# Patient Record
Sex: Male | Born: 1940 | Race: White | Hispanic: No | Marital: Married | Smoking: Never smoker
Health system: Southern US, Community
[De-identification: ages and names within clinical notes are randomized; demographics above are authoritative.]

## PROBLEM LIST (undated history)

## (undated) DIAGNOSIS — E785 Hyperlipidemia, unspecified: Secondary | ICD-10-CM

## (undated) DIAGNOSIS — D649 Anemia, unspecified: Secondary | ICD-10-CM

## (undated) DIAGNOSIS — I499 Cardiac arrhythmia, unspecified: Secondary | ICD-10-CM

## (undated) DIAGNOSIS — R42 Dizziness and giddiness: Secondary | ICD-10-CM

## (undated) DIAGNOSIS — M1990 Unspecified osteoarthritis, unspecified site: Secondary | ICD-10-CM

## (undated) DIAGNOSIS — I2510 Atherosclerotic heart disease of native coronary artery without angina pectoris: Secondary | ICD-10-CM

## (undated) DIAGNOSIS — I10 Essential (primary) hypertension: Secondary | ICD-10-CM

## (undated) DIAGNOSIS — J189 Pneumonia, unspecified organism: Secondary | ICD-10-CM

## (undated) HISTORY — PX: TONSILLECTOMY: SUR1361

## (undated) HISTORY — DX: Essential (primary) hypertension: I10

## (undated) HISTORY — PX: EYE SURGERY: SHX253

## (undated) HISTORY — DX: Anemia, unspecified: D64.9

## (undated) HISTORY — DX: Hyperlipidemia, unspecified: E78.5

---

## 1968-04-24 HISTORY — PX: CARPAL TUNNEL RELEASE: SHX101

## 1997-04-24 HISTORY — PX: CARDIAC CATHETERIZATION: SHX172

## 2008-07-17 ENCOUNTER — Encounter: Payer: Self-pay | Admitting: Gastroenterology

## 2009-09-17 ENCOUNTER — Encounter: Payer: Self-pay | Admitting: Gastroenterology

## 2009-09-22 ENCOUNTER — Telehealth (INDEPENDENT_AMBULATORY_CARE_PROVIDER_SITE_OTHER): Payer: Self-pay

## 2010-04-26 ENCOUNTER — Encounter: Payer: Self-pay | Admitting: Orthopedic Surgery

## 2010-05-24 ENCOUNTER — Ambulatory Visit
Admission: RE | Admit: 2010-05-24 | Discharge: 2010-05-24 | Payer: Self-pay | Source: Home / Self Care | Attending: Orthopedic Surgery | Admitting: Orthopedic Surgery

## 2010-05-24 ENCOUNTER — Encounter: Payer: Self-pay | Admitting: Orthopedic Surgery

## 2010-05-24 DIAGNOSIS — M6740 Ganglion, unspecified site: Secondary | ICD-10-CM | POA: Insufficient documentation

## 2010-05-24 NOTE — Letter (Signed)
Summary: Internal Other Domingo Dimes  Internal Other Domingo Dimes   Imported By: Cloria Spring LPN 16/01/9603 54:09:81  _____________________________________________________________________  External Attachment:    Type:   Image     Comment:   External Document

## 2010-05-24 NOTE — Progress Notes (Signed)
Summary: up date med list  Phone Note Outgoing Call   Call placed by: Laporshia Hogen Call placed to: Patient Summary of Call: Reviewed med list, everything is the same, and no new medical hx. Initial call taken by: Cloria Spring LPN,  September 23, 5282 4:13 PM

## 2010-06-01 NOTE — Letter (Signed)
Summary: History form  History form   Imported By: Jacklynn Ganong 05/27/2010 08:49:34  _____________________________________________________________________  External Attachment:    Type:   Image     Comment:   External Document

## 2010-06-01 NOTE — Assessment & Plan Note (Signed)
Summary: CONSULT/TREAT FINGER CYST/NEED XRAY/REF [facility]   Visit Type:  new patient Referring Provider:  Dr. Dwana Melena  CC:  left hand.  History of Present Illness: I saw Jeremiah Murphy in the office today for an initial visit.  He is a 70 years old man with the complaint of:  left hand  Xrays today.  Medications: Lovastatin 20 mg, Atenolol 25 mg, Ecotrin 325 mg.  The patient reports a cyst or mass forming on the long finger, volar aspect of the middle phalanx. He said it was much larger 2 months ago and has since gone down. He has no pain over the area.  There is also a nodule on his thumb on the volar side, which she says actually hurts. This one does not. He says he has some difficulty when he tries to pick things up.  He has a history of carpal tunnel syndrome in the past. Doesn't seem to have symptoms currently.  Allergies (verified): No Known Drug Allergies  Past History:  Past Surgical History: Carpal Tunnel Release  Review of Systems  The review of systems is negative for Constitutional, Cardiovascular, Respiratory, Gastrointestinal, Genitourinary, Neurologic, Musculoskeletal, Endocrine, Psychiatric, Skin, HEENT, Immunology, and Hemoatologic.  Physical Exam  Axillary Nodes:  no significant adenopathy Psych:  alert and cooperative; normal mood and affect; normal attention span and concentration   Wrist/Hand Exam  General:    Well-developed, well-nourished, in no acute distress; alert and oriented x 3.    Skin:    on the volar aspect of the LEFT hand, long finger, middle phalanx. There is a mass with a bluish hue or color, which is approximately 5 x 7 mm, mobile, appears to be under the skin. It is nontender. The perfusion to the digit is normal. Color is normal.  Sensory:    Gross sensation intact in the upper extremities.    Motor:    Normal strength in the upper extremities.    Hand Exam:    Left:    Inspection:  Abnormal    he has full  range of motion in his LEFT hand   Impression & Recommendations:  Problem # 1:  GANGLION CYST (ICD-727.43) Assessment New  Orders: New Patient Level III (72536) Hand x-ray, minimum 3 views (73130) -a separate x-ray report.  Multiple views of the hand are shown. There is no evidence of bony involvement.  Impression normal hand. No evidence of mass coming from the bone.  Since this mass has started to get smaller. Recommend observation for 2 months recheck. If improved further. No further treatment necessary if not MRI of the hand.  Differential diagnosis includes ganglion cyst of tendon sheath, ganglion cyst, nodule of the tendon.  Patient Instructions: 1)  Please schedule a follow-up appointment in 2 months.check hand    Orders Added: 1)  New Patient Level III [99203] 2)  Hand x-ray, minimum 3 views [73130]

## 2010-06-01 NOTE — Letter (Signed)
Summary: *Orthopedic Consult Note  [facility]  [address]  [place], [place] [address]   Phone: [phone]  Fax: [phone]    Re:    Jeremiah Murphy DOB:    02-23-1941   Dear: Timothy Lasso    Thank you for requesting that we see the above patient for consultation.  A copy of the detailed office note will be sent under separate cover, for your review.  Evaluation today is consistent with:  1)  GANGLION CYST (ICD-727.43)   Our recommendation is for: observation and recheck in 2 months        Thank you for this opportunity to look after your patient.  Sincerely,   Terrance Mass. MD.

## 2010-07-26 ENCOUNTER — Ambulatory Visit: Payer: Self-pay | Admitting: Orthopedic Surgery

## 2011-07-13 ENCOUNTER — Ambulatory Visit (INDEPENDENT_AMBULATORY_CARE_PROVIDER_SITE_OTHER): Payer: Self-pay | Admitting: Otolaryngology

## 2011-08-31 ENCOUNTER — Ambulatory Visit (INDEPENDENT_AMBULATORY_CARE_PROVIDER_SITE_OTHER): Payer: Medicare Other | Admitting: Otolaryngology

## 2011-08-31 DIAGNOSIS — H905 Unspecified sensorineural hearing loss: Secondary | ICD-10-CM

## 2011-09-04 ENCOUNTER — Other Ambulatory Visit (INDEPENDENT_AMBULATORY_CARE_PROVIDER_SITE_OTHER): Payer: Self-pay | Admitting: Otolaryngology

## 2011-09-06 ENCOUNTER — Ambulatory Visit (HOSPITAL_COMMUNITY)
Admission: RE | Admit: 2011-09-06 | Discharge: 2011-09-06 | Disposition: A | Discharge status: Home / Self Care | Payer: Medicare Other | Source: Ambulatory Visit | Attending: Otolaryngology | Admitting: Otolaryngology

## 2011-09-06 DIAGNOSIS — G939 Disorder of brain, unspecified: Secondary | ICD-10-CM | POA: Insufficient documentation

## 2011-09-06 DIAGNOSIS — H9190 Unspecified hearing loss, unspecified ear: Secondary | ICD-10-CM | POA: Insufficient documentation

## 2011-09-06 LAB — CREATININE, SERUM: Creatinine, Ser: 1.05 mg/dL (ref 0.50–1.35)

## 2011-09-06 MED ORDER — GADOBENATE DIMEGLUMINE 529 MG/ML IV SOLN
15.0000 mL | Freq: Once | INTRAVENOUS | Status: AC | PRN
  Administered 2011-09-06: 15 mL via INTRAVENOUS

## 2012-10-17 ENCOUNTER — Ambulatory Visit (INDEPENDENT_AMBULATORY_CARE_PROVIDER_SITE_OTHER): Payer: Medicare Other | Admitting: Otolaryngology

## 2012-12-05 ENCOUNTER — Ambulatory Visit (INDEPENDENT_AMBULATORY_CARE_PROVIDER_SITE_OTHER): Payer: Medicare Other | Admitting: Otolaryngology

## 2012-12-05 DIAGNOSIS — H903 Sensorineural hearing loss, bilateral: Secondary | ICD-10-CM

## 2012-12-05 DIAGNOSIS — H905 Unspecified sensorineural hearing loss: Secondary | ICD-10-CM

## 2012-12-05 DIAGNOSIS — H6120 Impacted cerumen, unspecified ear: Secondary | ICD-10-CM

## 2013-04-28 ENCOUNTER — Encounter: Payer: Self-pay | Admitting: Gastroenterology

## 2013-05-22 ENCOUNTER — Ambulatory Visit: Payer: Medicare Other | Admitting: Gastroenterology

## 2013-06-10 ENCOUNTER — Ambulatory Visit: Payer: Medicare Other | Admitting: Gastroenterology

## 2013-06-24 ENCOUNTER — Encounter: Payer: Self-pay | Admitting: Gastroenterology

## 2013-06-24 ENCOUNTER — Encounter (INDEPENDENT_AMBULATORY_CARE_PROVIDER_SITE_OTHER): Payer: Self-pay

## 2013-06-24 ENCOUNTER — Other Ambulatory Visit: Payer: Self-pay | Admitting: Internal Medicine

## 2013-06-24 ENCOUNTER — Ambulatory Visit (INDEPENDENT_AMBULATORY_CARE_PROVIDER_SITE_OTHER): Payer: Commercial Managed Care - HMO | Admitting: Gastroenterology

## 2013-06-24 VITALS — BP 153/78 | HR 82 | Temp 97.3°F | Ht 68.0 in | Wt 169.0 lb

## 2013-06-24 DIAGNOSIS — D649 Anemia, unspecified: Secondary | ICD-10-CM | POA: Insufficient documentation

## 2013-06-24 MED ORDER — PEG 3350-KCL-NA BICARB-NACL 420 G PO SOLR: 4000.0000 mL | ORAL | Status: DC

## 2013-06-24 NOTE — Patient Instructions (Signed)
1. Collect stool specimen and return to our office. 2. Colonoscopy as scheduled. See separate instructions.

## 2013-06-24 NOTE — Assessment & Plan Note (Signed)
73 year old gentleman who presents for further workup of normocytic anemia. Hemoccult status unknown. Denies overt GI bleeding. He has never had a colonoscopy. Denies upper GI symptoms. Plan for colonoscopy in the near future.  I have discussed the risks, alternatives, benefits with regards to but not limited to the risk of reaction to medication, bleeding, infection, perforation and the patient is agreeable to proceed. Written consent to be obtained.  Complete ifobt.

## 2013-06-24 NOTE — Progress Notes (Signed)
Primary Care Physician:  Catalina Pizza, MD  Primary Gastroenterologist:  Roetta Sessions, MD   Chief Complaint  Patient presents with  . Anemia  . Colonoscopy    HPI:  Jeremiah Murphy is a 73 y.o. male here for further evaluation of normocytic anemia. Patient recently had a physical and lab work showed a hemoglobin of 12.6. Hematocrit 36.5, MCV 91. Hemoccult status unknown. States he used to have problems with constipation but this resolved with increasing dietary fiber. His bowel movements are now regular. Denies any blood or melena. Denies abdominal pain, vomiting, heartburn, weight loss, dysphagia, odynophagia. Appetite is good. No prior colonoscopy.    Current Outpatient Prescriptions  Medication Sig Dispense Refill  . aspirin 325 MG tablet Take 325 mg by mouth daily.      Marland Kitchen atenolol (TENORMIN) 25 MG tablet Take 25 mg by mouth daily.       . simvastatin (ZOCOR) 20 MG tablet Take 20 mg by mouth daily at 6 PM.        No current facility-administered medications for this visit.    Allergies as of 06/24/2013  . (No Known Allergies)    Past Medical History  Diagnosis Date  . HTN (hypertension)   . Dyslipidemia   . Normocytic anemia     Past Surgical History  Procedure Laterality Date  . Carpal tunnel release  1970    left    Family History  Problem Relation Age of Onset  . Breast cancer Mother   . Breast cancer Sister   . Breast cancer Sister   . Colon cancer Neg Hx     History   Social History  . Marital Status: Married    Spouse Name: N/A    Number of Children: N/A  . Years of Education: N/A   Occupational History  . Not on file.   Social History Main Topics  . Smoking status: Former Games developer  . Smokeless tobacco: Not on file  . Alcohol Use: No  . Drug Use: No  . Sexual Activity: Not on file   Other Topics Concern  . Not on file   Social History Narrative  . No narrative on file      ROS:  General: Negative for anorexia, weight loss, fever,  chills, fatigue, weakness. Eyes: Negative for vision changes.  ENT: Negative for hoarseness, difficulty swallowing , nasal congestion. CV: Negative for chest pain, angina, palpitations, dyspnea on exertion, peripheral edema.  Respiratory: Negative for dyspnea at rest, dyspnea on exertion, cough, sputum, wheezing.  GI: See history of present illness. GU:  Negative for dysuria, hematuria, urinary incontinence, urinary frequency, nocturnal urination.  MS: Negative for joint pain, low back pain.  Derm: Negative for rash or itching.  Neuro: Negative for weakness, abnormal sensation, seizure, frequent headaches, memory loss, confusion.  Psych: Negative for anxiety, depression, suicidal ideation, hallucinations.  Endo: Negative for unusual weight change.  Heme: Negative for bruising or bleeding. Allergy: Negative for rash or hives.    Physical Examination:  BP 153/78  Pulse 82  Temp(Src) 97.3 F (36.3 C) (Oral)  Ht 5\' 8"  (1.727 m)  Wt 169 lb (76.658 kg)  BMI 25.70 kg/m2   General: Well-nourished, well-developed in no acute distress.  Head: Normocephalic, atraumatic.   Eyes: Conjunctiva pink, no icterus. Mouth: Oropharyngeal mucosa moist and pink , no lesions erythema or exudate. Neck: Supple without thyromegaly, masses, or lymphadenopathy.  Lungs: Clear to auscultation bilaterally.  Heart: Regular rate and rhythm, no murmurs rubs or gallops.  Abdomen: Bowel sounds are normal, nontender, nondistended, no hepatosplenomegaly or masses, no abdominal bruits or    hernia , no rebound or guarding.   Rectal: defer Extremities: No lower extremity edema. No clubbing or deformities.  Neuro: Alert and oriented x 4 , grossly normal neurologically.  Skin: Warm and dry, no rash or jaundice.   Psych: Alert and cooperative, normal mood and affect.  Labs: Labs from 04/22/2013 White blood cell count 5900, hemoglobin 12.6, hematocrit 36.5, MCV 91, platelets 220,000. BUN 14, creatinine 0.97, total  bilirubin 0.5, alkaline phosphatase 49, AST 25, ALT 14, albumin 4.1.  Imaging Studies: No results found.

## 2013-06-24 NOTE — Progress Notes (Signed)
cc'd to pcp 

## 2013-06-30 ENCOUNTER — Encounter (HOSPITAL_COMMUNITY): Payer: Self-pay | Admitting: Pharmacy Technician

## 2013-07-02 ENCOUNTER — Ambulatory Visit (INDEPENDENT_AMBULATORY_CARE_PROVIDER_SITE_OTHER): Payer: Commercial Managed Care - HMO | Admitting: Gastroenterology

## 2013-07-02 DIAGNOSIS — D649 Anemia, unspecified: Secondary | ICD-10-CM

## 2013-07-02 LAB — IFOBT (OCCULT BLOOD): IFOBT: NEGATIVE

## 2013-07-02 NOTE — Progress Notes (Signed)
Pt return IFOBT test and it was NEGATIVE. 

## 2013-07-11 ENCOUNTER — Telehealth: Payer: Self-pay | Admitting: Gastroenterology

## 2013-07-11 NOTE — Telephone Encounter (Signed)
Patient has called and cancelled his TCS w/RMR on 03/25 he states that he cant do it, he cant be on a clear liquid diet for that long without getting sick, and he is refusing to do it at this time

## 2013-07-15 NOTE — Telephone Encounter (Signed)
Noted  

## 2013-07-16 ENCOUNTER — Encounter (HOSPITAL_COMMUNITY): Admission: RE | Payer: Self-pay | Source: Ambulatory Visit

## 2013-07-16 ENCOUNTER — Ambulatory Visit (HOSPITAL_COMMUNITY): Admission: RE | Admit: 2013-07-16 | Payer: Medicare Other | Source: Ambulatory Visit | Admitting: Internal Medicine

## 2013-07-16 SURGERY — COLONOSCOPY
Anesthesia: Moderate Sedation

## 2013-09-19 ENCOUNTER — Ambulatory Visit (HOSPITAL_COMMUNITY)
Admission: RE | Admit: 2013-09-19 | Discharge: 2013-09-19 | Disposition: A | Discharge status: Home / Self Care | Payer: Medicare PPO | Source: Ambulatory Visit | Attending: Internal Medicine | Admitting: Internal Medicine

## 2013-09-19 ENCOUNTER — Other Ambulatory Visit (HOSPITAL_COMMUNITY): Payer: Self-pay | Admitting: Internal Medicine

## 2013-09-19 DIAGNOSIS — R52 Pain, unspecified: Secondary | ICD-10-CM

## 2013-09-19 DIAGNOSIS — R609 Edema, unspecified: Secondary | ICD-10-CM

## 2013-09-19 DIAGNOSIS — M25569 Pain in unspecified knee: Secondary | ICD-10-CM | POA: Insufficient documentation

## 2013-12-19 ENCOUNTER — Other Ambulatory Visit (HOSPITAL_COMMUNITY): Payer: Self-pay | Admitting: Internal Medicine

## 2013-12-19 DIAGNOSIS — M25561 Pain in right knee: Secondary | ICD-10-CM

## 2013-12-24 ENCOUNTER — Ambulatory Visit (HOSPITAL_COMMUNITY)
Admission: RE | Admit: 2013-12-24 | Discharge: 2013-12-24 | Disposition: A | Discharge status: Home / Self Care | Payer: Medicare PPO | Source: Ambulatory Visit | Attending: Internal Medicine | Admitting: Internal Medicine

## 2013-12-24 DIAGNOSIS — M25569 Pain in unspecified knee: Secondary | ICD-10-CM | POA: Diagnosis present

## 2013-12-24 DIAGNOSIS — M6740 Ganglion, unspecified site: Secondary | ICD-10-CM | POA: Insufficient documentation

## 2013-12-24 DIAGNOSIS — M25561 Pain in right knee: Secondary | ICD-10-CM

## 2014-01-19 ENCOUNTER — Ambulatory Visit (INDEPENDENT_AMBULATORY_CARE_PROVIDER_SITE_OTHER): Payer: Commercial Managed Care - HMO | Admitting: Orthopedic Surgery

## 2014-01-19 ENCOUNTER — Ambulatory Visit: Payer: Commercial Managed Care - HMO

## 2014-01-19 VITALS — BP 160/84 | Ht 68.0 in | Wt 173.0 lb

## 2014-01-19 DIAGNOSIS — M25561 Pain in right knee: Secondary | ICD-10-CM

## 2014-01-19 DIAGNOSIS — M25569 Pain in unspecified knee: Secondary | ICD-10-CM

## 2014-01-19 DIAGNOSIS — M792 Neuralgia and neuritis, unspecified: Secondary | ICD-10-CM

## 2014-01-19 DIAGNOSIS — G5790 Unspecified mononeuropathy of unspecified lower limb: Secondary | ICD-10-CM

## 2014-01-19 MED ORDER — GABAPENTIN 100 MG PO CAPS: 100.0000 mg | ORAL_CAPSULE | Freq: Three times a day (TID) | ORAL | Status: DC

## 2014-01-19 NOTE — Patient Instructions (Signed)

## 2014-01-19 NOTE — Progress Notes (Signed)
New patient  Referral Dr. Dwana Melena  Pharmacy [facility]  Patient complains of right knee pain  73 year old male with the following:  Patient started having pain last September denies any injury. He complains of an aching pain in the posterior aspect of his knee in the popliteal fossa associated with calf pain and numbness and tingling in his foot with some mild medial and lateral joint line pain which is worse at night and is exacerbated by sitting. 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 The patient also reports history of lumbar disc pain without radiculopathy or previous surgical intervention.  Medical history is negative he did have a carpal tunnel release in 1984  His current medications are simvastatin 20 atenolol 25 mg Ecotrin 325 meloxicam 15 mg Osteo Bi-Flex.  He has review of systems findings of hearing loss ringing of the ears seasonal allergies and all other systems were negative.  No medications drug allergies  Family history of cancer  He has no social habits  Previously employed in a factory lifting heavy boxes  BP 160/84  Ht  (1.727 m)  Wt 173 lb (78.472 kg)  BMI 26.31 kg/m2 The patient is oriented to person place and time appropriately. His mood and affect are pleasant and normal. His appearance is well-developed and nourished no deformities grooming is excellent. He ambulates without any assistive devices  His upper extremities show no alignment issues or deformities, full range of motion without contracture subluxation atrophy or tremor skin is normal pulses are negative and sensation is normal to soft touch and pressure  The left knee is well aligned without joint effusion. He has full range of motion. All ligaments are  stable. Muscle tone and strength is normal and grade 5 skin is normal distal pulses are intact swelling sensation is normal reflexes are normal straight leg raises are negative and coordination is normal  The right knee is nontender to palpation over either joint line there is no joint effusion alignment is normal all ligaments are stable strength and muscle tone are normal strength is grade 5 motor. Skin is intact. Pulse and temperature are normal without edema. He has no groin adenopathy. He has normal sensation normal reflexes. He [place] Scrape sign is negative. He has tenderness in the popliteal fossa and calf tracking the tibial nerve graft the lateral joint line specifically is negative for tenderness at the cyst is noted on the MRI. The lateral femoral condyle nontender as well.  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  His right straight leg raise reproduced pain in the back of his knee but also this was tightness  Recommend Neurontin 100 mg 3 times a day for neurogenic pain

## 2014-03-03 ENCOUNTER — Ambulatory Visit (INDEPENDENT_AMBULATORY_CARE_PROVIDER_SITE_OTHER): Payer: Commercial Managed Care - HMO | Admitting: Orthopedic Surgery

## 2014-03-03 ENCOUNTER — Encounter: Payer: Self-pay | Admitting: Orthopedic Surgery

## 2014-03-03 VITALS — BP 146/77 | Ht 68.0 in | Wt 173.0 lb

## 2014-03-03 DIAGNOSIS — G5791 Unspecified mononeuropathy of right lower limb: Secondary | ICD-10-CM

## 2014-03-03 DIAGNOSIS — M792 Neuralgia and neuritis, unspecified: Secondary | ICD-10-CM

## 2014-03-03 MED ORDER — GABAPENTIN 100 MG PO CAPS: 100.0000 mg | ORAL_CAPSULE | Freq: Three times a day (TID) | ORAL | Status: DC

## 2014-03-03 NOTE — Progress Notes (Signed)
Patient ID: Jeremiah CellaDouglas A Newstrom, male   DOB: Nov 19, 1940, 73 y.o.   MRN: [id number] Encounter Diagnosis  Name Primary?  . Neurogenic pain, leg, right Yes    Chief Complaint  Patient presents with  . Follow-up    6 week recheck right leg pain s/p medication    The patient responded very well to the gabapentin and his symptoms have basically resolved as long as he continues to take it.  Recommend continue gabapentin refill his medication follow-up as needed

## 2014-06-10 DIAGNOSIS — E782 Mixed hyperlipidemia: Secondary | ICD-10-CM | POA: Diagnosis not present

## 2014-06-10 DIAGNOSIS — Z125 Encounter for screening for malignant neoplasm of prostate: Secondary | ICD-10-CM | POA: Diagnosis not present

## 2014-06-12 DIAGNOSIS — M25569 Pain in unspecified knee: Secondary | ICD-10-CM | POA: Diagnosis not present

## 2014-06-12 DIAGNOSIS — I10 Essential (primary) hypertension: Secondary | ICD-10-CM | POA: Diagnosis not present

## 2014-06-12 DIAGNOSIS — Z6827 Body mass index (BMI) 27.0-27.9, adult: Secondary | ICD-10-CM | POA: Diagnosis not present

## 2014-06-12 DIAGNOSIS — E782 Mixed hyperlipidemia: Secondary | ICD-10-CM | POA: Diagnosis not present

## 2014-07-10 ENCOUNTER — Observation Stay (HOSPITAL_COMMUNITY)
Admission: EM | Admit: 2014-07-10 | Discharge: 2014-07-10 | Disposition: A | Discharge status: Home / Self Care | Payer: Commercial Managed Care - HMO | Attending: Family Medicine | Admitting: Family Medicine

## 2014-07-10 ENCOUNTER — Emergency Department (HOSPITAL_COMMUNITY): Payer: Commercial Managed Care - HMO

## 2014-07-10 ENCOUNTER — Encounter (HOSPITAL_COMMUNITY): Payer: Self-pay | Admitting: Emergency Medicine

## 2014-07-10 DIAGNOSIS — R51 Headache: Secondary | ICD-10-CM | POA: Diagnosis not present

## 2014-07-10 DIAGNOSIS — H81399 Other peripheral vertigo, unspecified ear: Secondary | ICD-10-CM | POA: Diagnosis not present

## 2014-07-10 DIAGNOSIS — R42 Dizziness and giddiness: Secondary | ICD-10-CM | POA: Diagnosis not present

## 2014-07-10 DIAGNOSIS — G4489 Other headache syndrome: Secondary | ICD-10-CM | POA: Insufficient documentation

## 2014-07-10 DIAGNOSIS — I10 Essential (primary) hypertension: Secondary | ICD-10-CM | POA: Diagnosis present

## 2014-07-10 DIAGNOSIS — I639 Cerebral infarction, unspecified: Secondary | ICD-10-CM | POA: Diagnosis not present

## 2014-07-10 DIAGNOSIS — E785 Hyperlipidemia, unspecified: Secondary | ICD-10-CM | POA: Diagnosis not present

## 2014-07-10 DIAGNOSIS — Z87891 Personal history of nicotine dependence: Secondary | ICD-10-CM | POA: Insufficient documentation

## 2014-07-10 DIAGNOSIS — Z7982 Long term (current) use of aspirin: Secondary | ICD-10-CM | POA: Diagnosis not present

## 2014-07-10 DIAGNOSIS — D649 Anemia, unspecified: Secondary | ICD-10-CM | POA: Diagnosis not present

## 2014-07-10 DIAGNOSIS — Z79899 Other long term (current) drug therapy: Secondary | ICD-10-CM | POA: Insufficient documentation

## 2014-07-10 LAB — COMPREHENSIVE METABOLIC PANEL
ALK PHOS: 52 U/L (ref 39–117)
ALT: 16 U/L (ref 0–53)
ANION GAP: 5 (ref 5–15)
AST: 32 U/L (ref 0–37)
Albumin: 3.8 g/dL (ref 3.5–5.2)
BILIRUBIN TOTAL: 0.5 mg/dL (ref 0.3–1.2)
BUN: 16 mg/dL (ref 6–23)
CHLORIDE: 107 mmol/L (ref 96–112)
CO2: 27 mmol/L (ref 19–32)
CREATININE: 1.04 mg/dL (ref 0.50–1.35)
Calcium: 8.8 mg/dL (ref 8.4–10.5)
GFR, EST AFRICAN AMERICAN: 80 mL/min — AB (ref >90)
GFR, EST NON AFRICAN AMERICAN: 69 mL/min — AB (ref >90)
GLUCOSE: 127 mg/dL — AB (ref 70–99)
POTASSIUM: 3.8 mmol/L (ref 3.5–5.1)
Sodium: 139 mmol/L (ref 135–145)
Total Protein: 7.3 g/dL (ref 6.0–8.3)

## 2014-07-10 LAB — URINE MICROSCOPIC-ADD ON

## 2014-07-10 LAB — RAPID URINE DRUG SCREEN, HOSP PERFORMED
Amphetamines: NOT DETECTED
BARBITURATES: NOT DETECTED
Benzodiazepines: NOT DETECTED
COCAINE: NOT DETECTED
OPIATES: NOT DETECTED
TETRAHYDROCANNABINOL: NOT DETECTED

## 2014-07-10 LAB — URINALYSIS, ROUTINE W REFLEX MICROSCOPIC
Bilirubin Urine: NEGATIVE
Glucose, UA: NEGATIVE mg/dL
KETONES UR: NEGATIVE mg/dL
LEUKOCYTES UA: NEGATIVE
Nitrite: NEGATIVE
Protein, ur: NEGATIVE mg/dL
Specific Gravity, Urine: 1.015 (ref 1.005–1.030)
Urobilinogen, UA: 0.2 mg/dL (ref 0.0–1.0)
pH: 7.5 (ref 5.0–8.0)

## 2014-07-10 LAB — APTT: APTT: 29 s (ref 24–37)

## 2014-07-10 LAB — CBC
HEMATOCRIT: 35.6 % — AB (ref 39.0–52.0)
Hemoglobin: 11.7 g/dL — ABNORMAL LOW (ref 13.0–17.0)
MCH: 28.7 pg (ref 26.0–34.0)
MCHC: 32.9 g/dL (ref 30.0–36.0)
MCV: 87.5 fL (ref 78.0–100.0)
Platelets: 215 10*3/uL (ref 150–400)
RBC: 4.07 MIL/uL — ABNORMAL LOW (ref 4.22–5.81)
RDW: 14.1 % (ref 11.5–15.5)
WBC: 6.7 10*3/uL (ref 4.0–10.5)

## 2014-07-10 LAB — PROTIME-INR
INR: 0.95 (ref 0.00–1.49)
PROTHROMBIN TIME: 12.8 s (ref 11.6–15.2)

## 2014-07-10 LAB — DIFFERENTIAL
BASOS ABS: 0 10*3/uL (ref 0.0–0.1)
Basophils Relative: 1 % (ref 0–1)
Eosinophils Absolute: 0.2 10*3/uL (ref 0.0–0.7)
Eosinophils Relative: 3 % (ref 0–5)
LYMPHS ABS: 1.2 10*3/uL (ref 0.7–4.0)
LYMPHS PCT: 18 % (ref 12–46)
MONO ABS: 0.5 10*3/uL (ref 0.1–1.0)
Monocytes Relative: 7 % (ref 3–12)
NEUTROS ABS: 4.8 10*3/uL (ref 1.7–7.7)
Neutrophils Relative %: 71 % (ref 43–77)

## 2014-07-10 LAB — TROPONIN I: Troponin I: <0.03 ng/mL (ref <0.031)

## 2014-07-10 LAB — ETHANOL: Alcohol, Ethyl (B): 5 mg/dL (ref 0–9)

## 2014-07-10 MED ORDER — HEPARIN SODIUM (PORCINE) 5000 UNIT/ML IJ SOLN
5000.0000 [IU] | Freq: Three times a day (TID) | INTRAMUSCULAR | Status: DC
  Administered 2014-07-10: 5000 [IU] via SUBCUTANEOUS
  Filled 2014-07-10: qty 1

## 2014-07-10 MED ORDER — MECLIZINE HCL 25 MG PO TABS: 25.0000 mg | ORAL_TABLET | Freq: Three times a day (TID) | ORAL | Status: DC | PRN

## 2014-07-10 MED ORDER — MECLIZINE HCL 12.5 MG PO TABS
25.0000 mg | ORAL_TABLET | Freq: Three times a day (TID) | ORAL | Status: DC | PRN
  Administered 2014-07-10: 25 mg via ORAL
  Filled 2014-07-10: qty 2

## 2014-07-10 MED ORDER — MECLIZINE HCL 12.5 MG PO TABS
25.0000 mg | ORAL_TABLET | Freq: Once | ORAL | Status: AC
  Administered 2014-07-10: 25 mg via ORAL
  Filled 2014-07-10: qty 2

## 2014-07-10 MED ORDER — ATENOLOL 25 MG PO TABS
25.0000 mg | ORAL_TABLET | Freq: Every day | ORAL | Status: DC
  Administered 2014-07-10: 25 mg via ORAL
  Filled 2014-07-10: qty 1

## 2014-07-10 MED ORDER — PEG 3350-KCL-NA BICARB-NACL 420 G PO SOLR: 4000.0000 mL | ORAL | Status: DC

## 2014-07-10 MED ORDER — ASPIRIN 325 MG PO TABS
325.0000 mg | ORAL_TABLET | Freq: Every day | ORAL | Status: DC
  Administered 2014-07-10: 325 mg via ORAL
  Filled 2014-07-10: qty 1

## 2014-07-10 MED ORDER — GABAPENTIN 100 MG PO CAPS
100.0000 mg | ORAL_CAPSULE | Freq: Three times a day (TID) | ORAL | Status: DC
  Administered 2014-07-10 (×2): 100 mg via ORAL
  Filled 2014-07-10 (×2): qty 1

## 2014-07-10 MED ORDER — SIMVASTATIN 20 MG PO TABS
20.0000 mg | ORAL_TABLET | Freq: Every day | ORAL | Status: DC
  Administered 2014-07-10: 20 mg via ORAL
  Filled 2014-07-10: qty 1

## 2014-07-10 NOTE — ED Provider Notes (Signed)
CSN: [id number]     Arrival date & time 07/10/14  0011 History  This chart was scribed for Zadie Rhine, MD by Richarda Overlie, ED Scribe. This patient was seen in room APA19/APA19 and the patient's care was started 12:32 AM.    Chief Complaint  Patient presents with  . Dizziness   Patient is a 74 y.o. male presenting with dizziness. The history is provided by the patient. No language interpreter was used.  Dizziness Quality:  Lightheadedness Duration:  2 hours Timing:  Constant Progression:  Unchanged Chronicity:  New Context: not with loss of consciousness   Relieved by:  Nothing Associated symptoms: headaches   Associated symptoms: no chest pain, no shortness of breath, no vomiting and no weakness   Headaches:    Severity:  Moderate   Onset quality:  Gradual   Duration:  1 day   Progression:  Improving  HPI Comments: Jeremiah Murphy is a 74 y.o. male with a history of HTN who presents to the Emergency Department complaining of a HA that started 11PM last night. Pt states that he felt very lightheaded earlier when he was eating. Pt states his lightheadedness has not improved since onset. His wife states that she gave pt 2 aspirin for his HA which improved his pain. He denies any recent travel. Pt reports no similar prior episodes. He denies vomiting, vision loss, CP, SOB, abdominal pain, syncope and extremity weakness. No fall/trauma reported He reports he feels thing "are spinning"   Past Medical History  Diagnosis Date  . HTN (hypertension)   . Dyslipidemia   . Normocytic anemia    Past Surgical History  Procedure Laterality Date  . Carpal tunnel release  1970    left   Family History  Problem Relation Age of Onset  . Breast cancer Mother   . Breast cancer Sister   . Breast cancer Sister   . Colon cancer Neg Hx    History  Substance Use Topics  . Smoking status: Former Games developer  . Smokeless tobacco: Not on file  . Alcohol Use: No    Review of Systems   Eyes: Negative for visual disturbance.  Respiratory: Negative for shortness of breath.   Cardiovascular: Negative for chest pain.  Gastrointestinal: Negative for vomiting and abdominal pain.  Neurological: Positive for dizziness, light-headedness and headaches. Negative for syncope, weakness and numbness.  All other systems reviewed and are negative.     Allergies  Review of patient's allergies indicates no known allergies.  Home Medications   Prior to Admission medications   Medication Sig Start Date End Date Taking? Authorizing Provider  aspirin 325 MG tablet Take 325 mg by mouth daily.    Historical Provider, MD  atenolol (TENORMIN) 25 MG tablet Take 25 mg by mouth daily.  05/12/13   Historical Provider, MD  gabapentin (NEURONTIN) 100 MG capsule Take 1 capsule (100 mg total) by mouth 3 (three) times daily. 03/03/14   Vickki Hearing, MD  polyethylene glycol-electrolytes (TRILYTE) 420 G solution Take 4,000 mLs by mouth as directed. 06/24/13   Corbin Ade, MD  simvastatin (ZOCOR) 20 MG tablet Take 20 mg by mouth daily at 6 PM.  06/12/13   Historical Provider, MD   BP 152/73 mmHg  Pulse 78  Temp(Src) 98.6 F (37 C)  Resp 18  Ht  (1.727 m)  Wt 170 lb (77.111 kg)  BMI 25.85 kg/m2  SpO2 99% Physical Exam CONSTITUTIONAL: Well developed/well nourished HEAD: Normocephalic/atraumatic EYES: EOMI/PERRL, mild horizontal  nystagmus that fatigues, no ptosis ENMT: Mucous membranes moist. Right TM obscured by cerumen.  Left TM intact/clear NECK: supple no meningeal signs, no bruits CV: S1/S2 noted, no murmurs/rubs/gallops noted LUNGS: Lungs are clear to auscultation bilaterally, no apparent distress ABDOMEN: soft, nontender, no rebound or guarding GU:no cva tenderness NEURO:Awake/alert, facies symmetric, no arm or leg drift is noted Equal 5/5 strength with shoulder abduction, elbow flex/extension, wrist flex/extension in upper extremities and equal hand grips bilaterally Equal  5/5 strength with hip flexion,knee flex/extension, foot dorsi/plantar flexion Cranial nerves 3/4/5/6/10/30/08/11/12 tested and intact Pt unsteady while ambulating  No past pointing Sensation to light touch intact in all extremities EXTREMITIES: pulses normal, full ROM SKIN: warm, color normal PSYCH: no abnormalities of mood noted   ED Course  Procedures   DIAGNOSTIC STUDIES: Oxygen Saturation is 99% on RA, normal by my interpretation.    COORDINATION OF CARE: 12:40 AM Discussed treatment plan with pt at bedside and pt agreed to plan.  tPA in stroke considered but not given due to: Symptoms improving (HA is improving) and also mild symptoms.  History c/w peripheral vertigo at this time  2:38 AM Pt improved Resting comfortably CT head negative Will try PO antivert and reassess Reports HA improved - I doubtSAH at this time 4:03 AM Pt with continued vertigo like symptoms He is unable to ambulate unassisted Will admit for monitoring and possible MRI D/w dr Conley RollsLE, will admit for OBS   Labs Review Labs Reviewed  CBC - Abnormal; Notable for the following:    RBC 4.07 (*)    Hemoglobin 11.7 (*)    HCT 35.6 (*)    All other components within normal limits  COMPREHENSIVE METABOLIC PANEL - Abnormal; Notable for the following:    Glucose, Bld 127 (*)    GFR calc non Af Amer 69 (*)    GFR calc Af Amer 80 (*)    All other components within normal limits  ETHANOL  PROTIME-INR  APTT  DIFFERENTIAL  TROPONIN I  URINE RAPID DRUG SCREEN (HOSP PERFORMED)  URINALYSIS, ROUTINE W REFLEX MICROSCOPIC    Imaging Review Ct Head Wo Contrast  07/10/2014   CLINICAL DATA:  Acute onset of headache. Lightheaded. Initial encounter.  EXAM: CT HEAD WITHOUT CONTRAST  TECHNIQUE: Contiguous axial images were obtained from the base of the skull through the vertex without intravenous contrast.  COMPARISON:  MRI of the brain performed 09/06/2011  FINDINGS: There is no evidence of acute infarction, mass  lesion, or intra- or extra-axial hemorrhage on CT.  Scattered periventricular and subcortical white matter change likely reflects small vessel ischemic microangiopathy. A chronic lacunar infarct is seen at the high right frontal lobe.  The posterior fossa, including the cerebellum, brainstem and fourth ventricle, is within normal limits. The third and lateral ventricles, and basal ganglia are unremarkable in appearance. No mass effect or midline shift is seen.  There is no evidence of fracture; visualized osseous structures are unremarkable in appearance. The visualized portions of the orbits are within normal limits. There is mild partial opacification of the left side of the sphenoid sinus. The remaining paranasal sinuses and mastoid air cells are well-aerated. No significant soft tissue abnormalities are seen.  IMPRESSION: 1. No acute intracranial pathology seen on CT. 2. Scattered small vessel ischemic microangiopathy. 3. 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 4. Mild partial opacification of the left side of the sphenoid sinus.   Electronically Signed   By: Roanna RaiderJeffery  Chang M.D.   On:  07/10/2014 02:24    ED ECG REPORT   Date: 07/10/2014 0046  Rate: 75  Rhythm: normal sinus rhythm  QRS Axis: normal  Intervals: normal  ST/T Wave abnormalities: nonspecific ST changes  Conduction Disutrbances:nonspecific intraventricular conduction delay   Medications  meclizine (ANTIVERT) tablet 25 mg (25 mg Oral Given 07/10/14 0238)    MDM   Final diagnoses:  Vertigo  Other headache syndrome    Nursing notes including past medical history and social history reviewed and considered in documentation Labs/vital reviewed myself and considered during evaluation   I personally performed the services described in this documentation, which was scribed in my presence. The recorded information has been reviewed and is accurate.      Zadie Rhine, MD 07/10/14 [phone]

## 2014-07-10 NOTE — H&P (Signed)
[facility] History and Physical  [name]:[id number] DOB: 1940-12-27    PCP:   Catalina Pizza, MD   Chief Complaint:  Vertigo.  HPI: Jeremiah Murphy is an 74 y.o. male with hx of HTN, dyslipidemia, presented to the ER with severe vertigo, started today.  He said its worst with head movement, and better if he stays still.  There was no HA, focal weakness or clumsiness, no fever, or chills.  He had nausea but no vomiting.  He denied any URI symptomology.  He did have a similar episode long time ago.  In the ER, head CT was done and showed no acute process.  His serology was unremarkable.  He was given Antivert, and his symptoms had improved, but he still wasn't able to ambulate.  Hospitalist was asked to admit him for symptomatic Tx of his vertigo.   Rewiew of Systems:  Constitutional: Negative for malaise, fever and chills. No significant weight loss or weight gain Eyes: Negative for eye pain, redness and discharge, diplopia, visual changes, or flashes of light. ENMT: Negative for ear pain, hoarseness, nasal congestion, sinus pressure and sore throat. No headaches; tinnitus, drooling, or problem swallowing. Cardiovascular: Negative for chest pain, palpitations, diaphoresis, dyspnea and peripheral edema. ; No orthopnea, PND Respiratory: Negative for cough, hemoptysis, wheezing and stridor. No pleuritic chestpain. Gastrointestinal: Negative for  vomiting, diarrhea, constipation, abdominal pain, melena, blood in stool, hematemesis, jaundice and rectal bleeding.    Genitourinary: Negative for frequency, dysuria, incontinence,flank pain and hematuria; Musculoskeletal: Negative for back pain and neck pain. Negative for swelling and trauma.;  Skin: . Negative for pruritus, rash, abrasions, bruising and skin lesion.; ulcerations Neuro: Negative for headache, lightheadedness and neck stiffness. Negative for weakness, altered level of consciousness , altered mental status, extremity  weakness, burning feet, involuntary movement, seizure and syncope.  Psych: negative for anxiety, depression, insomnia, tearfulness, panic attacks, hallucinations, paranoia, suicidal or homicidal ideation    Past Medical History  Diagnosis Date  . HTN (hypertension)   . Dyslipidemia   . Normocytic anemia     Past Surgical History  Procedure Laterality Date  . Carpal tunnel release  1970    left    Medications:  HOME MEDS: Prior to Admission medications   Medication Sig Start Date End Date Taking? Authorizing Provider  aspirin 325 MG tablet Take 325 mg by mouth daily.    Historical Provider, MD  atenolol (TENORMIN) 25 MG tablet Take 25 mg by mouth daily.  05/12/13   Historical Provider, MD  gabapentin (NEURONTIN) 100 MG capsule Take 1 capsule (100 mg total) by mouth 3 (three) times daily. 03/03/14   Vickki Hearing, MD  polyethylene glycol-electrolytes (TRILYTE) 420 G solution Take 4,000 mLs by mouth as directed. 06/24/13   Corbin Ade, MD  simvastatin (ZOCOR) 20 MG tablet Take 20 mg by mouth daily at 6 PM.  06/12/13   Historical Provider, MD     Allergies:  No Known Allergies  Social History:   reports that he has quit smoking. He does not have any smokeless tobacco history on file. He reports that he does not drink alcohol or use illicit drugs.  Family History: Family History  Problem Relation Age of Onset  . Breast cancer Mother   . Breast cancer Sister   . Breast cancer Sister   . Colon cancer Neg Hx      Physical Exam: Filed Vitals:   07/10/14 0015 07/10/14 0045 07/10/14 0112  BP: 152/73  140/68  Pulse: 78  75  Temp: 98.6 F (37 C) 97.8 F (36.6 C)   Resp: 18  20  Height:  (1.727 m)    Weight: 77.111 kg (170 lb)    SpO2: 99%  97%   Blood pressure 140/68, pulse 75, temperature 97.8 F (36.6 C), resp. rate 20, height  (1.727 m), weight 77.111 kg (170 lb), SpO2 97 %.  GEN:  Pleasant   patient lying in the stretcher in no acute distress;  cooperative with exam. PSYCH:  alert and oriented x4; does not appear anxious or depressed; affect is appropriate. HEENT: Mucous membranes pink and anicteric; PERRLA; EOM intact; no cervical lymphadenopathy nor thyromegaly or carotid bruit; no JVD; There were no stridor. Neck is very supple. Breasts:: Not examined CHEST WALL: No tenderness CHEST: Normal respiration, clear to auscultation bilaterally.  HEART: Regular rate and rhythm.  There is a soft murmur, rub, or gallops.   BACK: No kyphosis or scoliosis; no CVA tenderness ABDOMEN: soft and non-tender; no masses, no organomegaly, normal abdominal bowel sounds; no pannus; no intertriginous candida. There is no rebound and no distention. Rectal Exam: Not done EXTREMITIES: No bone or joint deformity; age-appropriate arthropathy of the hands and knees; no edema; no ulcerations.  There is no calf tenderness. Genitalia: not examined PULSES: 2+ and symmetric SKIN: Normal hydration no rash or ulceration CNS: Cranial nerves 2-12 grossly intact no focal lateralizing neurologic deficit.  Speech is fluent; uvula elevated with phonation, facial symmetry and tongue midline. DTR are normal bilaterally, cerebella exam is intact, barbinski is negative and strengths are equaled bilaterally.  No sensory loss.   Labs on Admission:  Basic Metabolic Panel:  Recent Labs Lab 07/10/14 0055  NA 139  K 3.8  CL 107  CO2 27  GLUCOSE 127*  BUN 16  CREATININE 1.04  CALCIUM 8.8   Liver Function Tests:  Recent Labs Lab 07/10/14 0055  AST 32  ALT 16  ALKPHOS 52  BILITOT 0.5  PROT 7.3  ALBUMIN 3.8   No results for input(s): LIPASE, AMYLASE in the last 168 hours. No results for input(s): AMMONIA in the last 168 hours. CBC:  Recent Labs Lab 07/10/14 0055  WBC 6.7  NEUTROABS 4.8  HGB 11.7*  HCT 35.6*  MCV 87.5  PLT 215   Cardiac Enzymes:  Recent Labs Lab 07/10/14 0055  TROPONINI <0.03    CBG: No results for input(s): GLUCAP in the last  168 hours.   Radiological Exams on Admission: Ct Head Wo Contrast  07/10/2014   CLINICAL DATA:  Acute onset of headache. Lightheaded. Initial encounter.  EXAM: CT HEAD WITHOUT CONTRAST  TECHNIQUE: Contiguous axial images were obtained from the base of the skull through the vertex without intravenous contrast.  COMPARISON:  MRI of the brain performed 09/06/2011  FINDINGS: There is no evidence of acute infarction, mass lesion, or intra- or extra-axial hemorrhage on CT.  Scattered periventricular and subcortical white matter change likely reflects small vessel ischemic microangiopathy. A chronic lacunar infarct is seen at the high right frontal lobe.  The posterior fossa, including the cerebellum, brainstem and fourth ventricle, is within normal limits. The third and lateral ventricles, and basal ganglia are unremarkable in appearance. No mass effect or midline shift is seen.  There is no evidence of fracture; visualized osseous structures are unremarkable in appearance. The visualized portions of the orbits are within normal limits. There is mild partial opacification of the left side of the sphenoid sinus. The remaining paranasal sinuses  and mastoid air cells are well-aerated. No significant soft tissue abnormalities are seen.  IMPRESSION: 1. No acute intracranial pathology seen on CT. 2. Scattered small vessel ischemic microangiopathy. 3. 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 4. Mild partial opacification of the left side of the sphenoid sinus.   Electronically Signed   By: Roanna RaiderJeffery  Chang M.D.   On: 07/10/2014 02:24   Assessment/Plan Present on Admission:  . HTN (hypertension) . Peripheral vertigo  PLAN:  His symptoms are most consistent with acute peripheral vertigo.  His cerebellar exam is negative, and his vertigo is definitely with head movements.  Will continue him on antivert, and add ativan if need be.   I will continue with his home meds.  He will continue with his ASA daily.   He is stable, full code, [facility] Thank you for allowing me to participate in his care.   Other plans as per orders.  Code Status: FULL Unk LightningODE.   Tushar Enns, MD. [facility] Pager [phone] 7pm to 7am.  07/10/2014, 4:38 AM

## 2014-07-10 NOTE — ED Notes (Signed)
While patient was in radiology, patient became sick with dizziness and vomiting

## 2014-07-10 NOTE — Evaluation (Signed)
Physical Therapy Evaluation Patient Details Name: Jeremiah Murphy MRN: [id number] DOB: 21-Mar-1941 Today's Date: 07/10/2014   History of Present Illness  Jeremiah Murphy is an 74 y.o. male with hx of HTN, dyslipidemia, presented to the ER with severe vertigo, started today. He said its worst with head movement, and better if he stays still. There was no HA, focal weakness or clumsiness, no fever, or chills. He had nausea but no vomiting. He denied any URI symptomology. He did have a similar episode long time ago. In the ER, head CT was done and showed no acute process. His serology was unremarkable. He was given Antivert, and his symptoms had improved, but he still wasn't able to ambulate. Hospitalist was asked to admit him for symptomatic Tx of his vertigo. Pt lives with his wife and is normally independent in all ADLs with no assistive device.  He does c/o chronic neck and back pain as well as right knee pain.  Clinical Impression  Pt was seen for evaluation.  He states that symptoms of dizziness started very suddenly but have improved since yesterday.  He feels no dizziness at rest and mild dizziness with movement (head turns or trunk turning) to either side.  The dizziness subsides after 30 sec to 1 minute.  It occurs in supine, sitting and standing.  He had no observable nystagmus.  He was instructed in Epley self maneuver  To be done 3 times/day.  Wife was also instructed.  His gait was evaluated and gait was unstable with no assistive device.  He was instructed in gait using a walker and gait is now stable.  He was also instructed in general precautions for minimizing dizziness.  If dizziness persists I would recommend that he followup with the OPPT department for vestibular rehab.    Follow Up Recommendations No PT follow up    Equipment Recommendations  Rolling walker with 5" wheels    Recommendations for Other Services   none    Precautions / Restrictions  Precautions Precautions: Fall Restrictions Weight Bearing Restrictions: No      Mobility  Bed Mobility Overal bed mobility: Independent                Transfers Overall transfer level: Modified independent Equipment used: None             General transfer comment: pt has some difficulty in rising from sitting due to chronic back problems and OA of the right knee  Ambulation/Gait Ambulation/Gait assistance: Supervision Ambulation Distance (Feet): 100 Feet Assistive device: Rolling walker (2 wheeled) Gait Pattern/deviations: Trunk flexed Gait velocity: appropriate for situation Gait velocity interpretation: Below normal speed for age/gender General Gait Details: walker is needed due to mild dizziness which is intermittent in nature...walker also helps to stabilize the right knee  Stairs            Wheelchair Mobility    Modified Rankin (Stroke Patients Only)       Balance Overall balance assessment: Needs assistance Sitting-balance support: No upper extremity supported;Feet supported Sitting balance-Leahy Scale: Normal     Standing balance support: No upper extremity supported Standing balance-Leahy Scale: Fair Standing balance comment: balance decreased due to mild dizziness                             Pertinent Vitals/Pain Pain Assessment: No/denies pain    Home Living Family/patient expects to be discharged to:: Private residence Living Arrangements: Spouse/significant other  Available Help at Discharge: Family;Available 24 hours/day Type of Home: House Home Access: Stairs to enter Entrance Stairs-Rails: Right;Left;Can reach both Entrance Stairs-Number of Steps: 2 Home Layout: One level Home Equipment: None      Prior Function Level of Independence: Independent               Hand Dominance        Extremity/Trunk Assessment   Upper Extremity Assessment: Overall WFL for tasks assessed           Lower  Extremity Assessment:  (pt has OA of the right knee which causes some instabiltiy)         Communication   Communication: No difficulties  Cognition Arousal/Alertness: Awake/alert Behavior During Therapy: WFL for tasks assessed/performed Overall Cognitive Status: Within Functional Limits for tasks assessed                      General Comments      Exercises        Assessment/Plan    PT Assessment All further PT needs can be met in the next venue of care (vestibular clinic if needed)  PT Diagnosis Difficulty walking (dizziness)   PT Problem List Decreased mobility  PT Treatment Interventions     PT Goals (Current goals can be found in the Care Plan section) Acute Rehab PT Goals PT Goal Formulation: All assessment and education complete, DC therapy    Frequency     Barriers to discharge  none      Co-evaluation               End of Session Equipment Utilized During Treatment: Gait belt Activity Tolerance: Patient tolerated treatment well Patient left: in bed;with call bell/phone within reach;with bed alarm set Nurse Communication: Mobility status    Functional Assessment Tool Used: clinical judgement Functional Limitation: Mobility: Walking and moving around Mobility: Walking and Moving Around Current Status (S9373): At least 1 percent but less than 20 percent impaired, limited or restricted Mobility: Walking and Moving Around Goal Status [phone]): At least 1 percent but less than 20 percent impaired, limited or restricted Mobility: Walking and Moving Around Discharge Status [phone]): At least 1 percent but less than 20 percent impaired, limited or restricted    Time: 1430-1521 PT Time Calculation (min) (ACUTE ONLY): 51 min   Charges:   PT Evaluation $Initial PT Evaluation Tier I: 1 Procedure PT Treatments $Self Care/Home Management: 8-22   PT G Codes:   PT G-Codes **NOT FOR INPATIENT CLASS** Functional Assessment Tool Used: clinical  judgement Functional Limitation: Mobility: Walking and moving around Mobility: Walking and Moving Around Current Status (W6203): At least 1 percent but less than 20 percent impaired, limited or restricted Mobility: Walking and Moving Around Goal Status [phone]): At least 1 percent but less than 20 percent impaired, limited or restricted Mobility: Walking and Moving Around Discharge Status [phone]): At least 1 percent but less than 20 percent impaired, limited or restricted    Sable Feil 07/10/2014, 3:34 PM

## 2014-07-10 NOTE — ED Notes (Signed)
Pt had headache all day and now c/o dizziness.

## 2014-07-10 NOTE — Progress Notes (Signed)
  PROGRESS NOTE  Jeremiah Murphy QMV:[id number] DOB: [birth year] DOA: 07/10/2014 PCP: Catalina PizzaHALL, ZACH, MD  Summary: 73yom presented with acute onset of vertigo worsened with head mvoement, better with rest. No focal neuro deficits. EDP noted headache. CT head non-acute. Had difficulty ambulating because of vertigo and was admitted for further evaluation and treatment.  Assessment/Plan: 1. Vertigo with difficulty ambulating. Suspect peripheral. CT head non-acute.  2. HA. Resolved.  3. HTN stable. 4. Normocytic anemia stable.    Much improved. Vertigo resolved. History c/w peripheral vertigo, now resolved. Plan PT evaluation.  Discussed with wife. Plan PT eval, hold off on MRI given lack of symptoms or focal findings. Suspect peripheral.  Home later today most likely.  [name], MD  [facility]  Pager [phone] If 7PM-7AM, please contact night-coverage at [email or web address], password [email or web address] 07/10/2014, 10:55 AM    Consultants:    Procedures:    Antibiotics:    HPI/Subjective: Feels much better, no HA, no weakness, no parasthesias. Hungry. Still has some vertigo with movement but much better.  Passed swallow screen per RN, took meds without difficulty this AM.  Objective: Filed Vitals:   07/10/14 0300 07/10/14 0400 07/10/14 0430 07/10/14 0514  BP: 146/74 135/77 127/72 144/85  Pulse: 75 72 84 94  Temp:    97.8 F (36.6 C)  TempSrc:    Oral  Resp: 15 14 12 16   Height:      Weight:    78.608 kg (173 lb 4.8 oz)  SpO2: 99% 99% 97% 99%    Intake/Output Summary (Last 24 hours) at 07/10/14 1055 Last data filed at 07/10/14 0900  Gross per 24 hour  Intake      0 ml  Output      0 ml  Net      0 ml     Filed Weights   07/10/14 0015 07/10/14 0514  Weight: 77.111 kg (170 lb) 78.608 kg (173 lb 4.8 oz)    Exam:     Afebrile, VSS, no hypoxia  General: Appears calm and comfortable Eyes: PERRL, normal lids, irises  ENT: grossly normal hearing, lips &  tongue Cardiovascular: RRR, no m/r/g. No LE edema. Respiratory: CTA bilaterally, no w/r/r. Normal respiratory effort. Skin: no rash or induration noted on limited exam Musculoskeletal: grossly normal tone BUE/BLE. Moves all extremities to command, strength 5/5 and symmetric BUE/BLE.  Psychiatric: grossly normal mood and affect, speech fluent and appropriate Neurologic: CN appear intact, no nystagmus noted. No pronator drift, no pass-pointing.  Data Reviewed:  CMP unremarkable  Troponin negative   CT head no acute intracranial pathology seen  EKG SR  CBC unremarkable  Urinalysis and UDS negative  Scheduled Meds: . aspirin  325 mg Oral Daily  . atenolol  25 mg Oral Daily  . gabapentin  100 mg Oral TID  . heparin  5,000 Units Subcutaneous 3 times per day  . simvastatin  20 mg Oral q1800   Continuous Infusions:   Principal Problem:   Vertigo Active Problems:   HTN (hypertension)   Peripheral vertigo

## 2014-07-10 NOTE — Progress Notes (Signed)
Notified Dr. Irene LimboGoodrich due the patients wife's concern of the patient not being allowed to have anything to eat/drink.  I voiced to the MD that I did not see any indication of why the patient should be NPO as evidence by the CT head scan being negative, the patient did pass his swallow evaluation by the RN, and that I had given his po pills with a sip of water and he tolerated them without difficulty: AEB no coughing or c/o n/v after the meds.  MD stated that it was okay for the patient to have a diet that he would be up to see the patient.  I discussed this with the patient and wife and they verbalized understanding.

## 2014-07-10 NOTE — Care Management Note (Addendum)
    Page 1 of 1   07/10/2014     3:42:40 PM CARE MANAGEMENT NOTE 07/10/2014  Patient:  Donetta PottsCOLIPANO,Riely A   Account Number:  [id number]  Date Initiated:  07/10/2014  Documentation initiated by:  Sharrie RothmanBLACKWELL,[place] Bula C  Subjective/Objective Assessment:   Pt admitted from home with vertigo. Pt lives with his wife and will return home at discharge. Pt is independent with ADL's.     Action/Plan:   No CM needs noted. Anticipate discharge within 24 hours.   Anticipated DC Date:  07/11/2014   Anticipated DC Plan:  HOME/SELF CARE      DC Planning Services  CM consult      PAC Choice  DURABLE MEDICAL EQUIPMENT   Choice offered to / List presented to:  C-1 Patient   DME arranged  [facility]      [facility]        Status of service:  Completed, signed off Medicare Important Message given?   (If response is "NO", the following Medicare IM given date fields will be blank) Date Medicare IM given:   Medicare IM given by:   Date Additional Medicare IM given:   Additional Medicare IM given by:    Discharge Disposition:  HOME/SELF CARE  Per UR Regulation:    If discussed at Long Length of Stay Meetings, dates discussed:    Comments:  07/10/14 1540 Arlyss Queenammy Hadiya Spoerl, RN BSN CM PT recommended rolling walker for pt. [facility] and RW will be delivered to pts home. Pt and pts nurse aware.  07/10/14 1110 Arlyss Queenammy Irva Loser, RN BSN CM

## 2014-07-10 NOTE — Progress Notes (Signed)
Patient discharged with instructions, prescription, and care notes.  Verbalized understanding via teach back.  IV was removed and the site was WNL. Patient voiced no further complaints or concerns at the time of discharge.  Appointments scheduled per instructions.  Patient left the floor via ambulation with staff and family in stable condition. 

## 2014-07-10 NOTE — Discharge Instructions (Signed)
Epley Maneuver Self-Care WHAT IS THE EPLEY MANEUVER? The Epley maneuver is an exercise you can do to relieve symptoms of benign paroxysmal positional vertigo (BPPV). This condition is often just referred to as vertigo. BPPV is caused by the movement of tiny crystals (canaliths) inside your inner ear. The accumulation and movement of canaliths in your inner ear causes a sudden spinning sensation (vertigo) when you move your head to certain positions. Vertigo usually lasts about 30 seconds. BPPV usually occurs in just one ear. If you get vertigo when you lie on your left side, you probably have BPPV in your left ear. Your health care provider can tell you which ear is involved.  BPPV may be caused by a head injury. Many people older than 50 get BPPV for unknown reasons. If you have been diagnosed with BPPV, your health care provider may teach you how to do this maneuver. BPPV is not life threatening (benign) and usually goes away in time.  WHEN SHOULD I PERFORM THE EPLEY MANEUVER? You can do this maneuver at home whenever you have symptoms of vertigo. You may do the Epley maneuver up to 3 times a day until your symptoms of vertigo go away. HOW SHOULD I DO THE EPLEY MANEUVER?  Sit on the edge of a bed or table with your back straight. Your legs should be extended or hanging over the edge of the bed or table.   Turn your head halfway toward the affected ear.   Lie backward quickly with your head turned until you are lying flat on your back. You may want to position a pillow under your shoulders.   Hold this position for 30 seconds. You may experience an attack of vertigo. This is normal. Hold this position until the vertigo stops.  Then turn your head to the opposite direction until your unaffected ear is facing the floor.   Hold this position for 30 seconds. You may experience an attack of vertigo. This is normal. Hold this position until the vertigo stops.  Now turn your whole body to the same  side as your head. Hold for another 30 seconds.   You can then sit back up. ARE THERE RISKS TO THIS MANEUVER? In some cases, you may have other symptoms (such as changes in your vision, weakness, or numbness). If you have these symptoms, stop doing the maneuver and call your health care provider. Even if doing these maneuvers relieves your vertigo, you may still have dizziness. Dizziness is the sensation of light-headedness but without the sensation of movement. Even though the Epley maneuver may relieve your vertigo, it is possible that your symptoms will return within 5 years. WHAT SHOULD I DO AFTER THIS MANEUVER? After doing the Epley maneuver, you can return to your normal activities. Ask your doctor if there is anything you should do at home to prevent vertigo. This may include:  Sleeping with two or more pillows to keep your head elevated.  Not sleeping on the side of your affected ear.  Getting up slowly from bed.  Avoiding sudden movements during the day.  Avoiding extreme head movement, like looking up or bending over.  Wearing a cervical collar to prevent sudden head movements. WHAT SHOULD I DO IF MY SYMPTOMS GET WORSE? Call your health care provider if your vertigo gets worse. Call your provider right way if you have other symptoms, including:   Nausea.  Vomiting.  Headache.  Weakness.  Numbness.  Vision changes. Document Released: 04/15/2013 Document Reviewed: 04/15/2013 ExitCare   Patient Information 2015 ExitCare, LLC. This information is not intended to replace advice given to you by your health care provider. Make sure you discuss any questions you have with your health care provider.   Benign Positional Vertigo Vertigo means you feel like you or your surroundings are moving when they are not. Benign positional vertigo is the most common form of vertigo. Benign means that the cause of your condition is not serious. Benign positional vertigo is more common in  older adults. CAUSES  Benign positional vertigo is the result of an upset in the labyrinth system. This is an area in the middle ear that helps control your balance. This may be caused by a viral infection, head injury, or repetitive motion. However, often no specific cause is found. SYMPTOMS  Symptoms of benign positional vertigo occur when you move your head or eyes in different directions. Some of the symptoms may include:  Loss of balance and falls.  Vomiting.  Blurred vision.  Dizziness.  Nausea.  Involuntary eye movements (nystagmus). DIAGNOSIS  Benign positional vertigo is usually diagnosed by physical exam. If the specific cause of your benign positional vertigo is unknown, your caregiver may perform imaging tests, such as magnetic resonance imaging (MRI) or computed tomography (CT). TREATMENT  Your caregiver may recommend movements or procedures to correct the benign positional vertigo. Medicines such as meclizine, benzodiazepines, and medicines for nausea may be used to treat your symptoms. In rare cases, if your symptoms are caused by certain conditions that affect the inner ear, you may need surgery. HOME CARE INSTRUCTIONS   Follow your caregiver's instructions.  Move slowly. Do not make sudden body or head movements.  Avoid driving.  Avoid operating heavy machinery.  Avoid performing any tasks that would be dangerous to you or others during a vertigo episode.  Drink enough fluids to keep your urine clear or pale yellow. SEEK IMMEDIATE MEDICAL CARE IF:   You develop problems with walking, weakness, numbness, or using your arms, hands, or legs.  You have difficulty speaking.  You develop severe headaches.  Your nausea or vomiting continues or gets worse.  You develop visual changes.  Your family or friends notice any behavioral changes.  Your condition gets worse.  You have a fever.  You develop a stiff neck or sensitivity to light. MAKE SURE YOU:    Understand these instructions.  Will watch your condition.  Will get help right away if you are not doing well or get worse. Document Released: 01/16/2006 Document Revised: 07/03/2011 Document Reviewed: 12/29/2010 ExitCare Patient Information 2015 ExitCare[employer]. This information is not intended to replace advice given to you by your health care provider. Make sure you discuss any questions you have with your health care provider.  

## 2014-07-10 NOTE — Discharge Summary (Signed)
Physician Discharge Summary  Jeremiah Murphy ZOX:[id number] DOB: December 11, 1940 DOA: 07/10/2014  PCP: Catalina Pizza, MD  Admit date: 07/10/2014 Discharge date: 07/10/2014  Recommendations for Outpatient Follow-up:  1. Resolution of peripheral vertigo 2. Normocytic anemia   Follow-up Information    Follow up with Catalina Pizza, MD.   Specialty:  Internal Medicine   Why:  If symptoms worsen   Contact information:    [address]  [place] [address] [phone]      Discharge Diagnoses:  1. Peripheral vertigo with gait instability 2. Normocytic anemia  Discharge Condition: Improved Disposition: Home  Diet recommendation: Regular  Filed Weights   07/10/14 0015 07/10/14 0514  Weight: 77.111 kg (170 lb) 78.608 kg (173 lb 4.8 oz)    History of present illness:  73yom presented with acute onset of vertigo worsened with head movement, better with rest. No focal neuro deficits. EDP noted headache. CT head non-acute. Had difficulty ambulating because of vertigo and was admitted for further evaluation and treatment.  Hospital Course:  Patient spontaneously improved with resolution of vertigo. Gait instability rapidly improved. Meclizine improved symptoms. His history, examination most suggestive of peripheral vertigo, discussed with physical therapy who concurred as well. He responded well to meclizine is stable for discharge. No further evaluation suggested at this time.  1. Vertigo with difficulty ambulating. Suspect peripheral. CT head non-acute.  2. HA. Resolved.  3. HTN stable. 4. Normocytic anemia stable.  MRI was considered but given his exam and symptomology with resolution, no further evaluation suggested  Discharge Instructions  Discharge Instructions    Diet - low sodium heart healthy    Complete by:  As directed      Discharge instructions    Complete by:  As directed   Call your physician or seek immediate medical attention for worsening vertigo, dizziness or  new symptoms such as weakness, falls or worsening of condition.     Increase activity slowly    Complete by:  As directed           Current Discharge Medication List    START taking these medications   Details  meclizine (ANTIVERT) 25 MG tablet Take 1 tablet (25 mg total) by mouth 3 (three) times daily as needed for dizziness. Qty: 30 tablet, Refills: 0      CONTINUE these medications which have NOT CHANGED   Details  aspirin 325 MG tablet Take 325 mg by mouth daily.    atenolol (TENORMIN) 25 MG tablet Take 25 mg by mouth daily.     gabapentin (NEURONTIN) 100 MG capsule Take 1 capsule (100 mg total) by mouth 3 (three) times daily. Qty: 90 capsule, Refills: 5   Associated Diagnoses: Neurogenic pain, leg, right    meloxicam (MOBIC) 15 MG tablet Take 15 mg by mouth daily.    simvastatin (ZOCOR) 20 MG tablet Take 20 mg by mouth daily at 6 PM.       STOP taking these medications     polyethylene glycol-electrolytes (TRILYTE) 420 G solution        No Known Allergies  The results of significant diagnostics from this hospitalization (including imaging, microbiology, ancillary and laboratory) are listed below for reference.    Significant Diagnostic Studies: Ct Head Wo Contrast  07/10/2014   CLINICAL DATA:  Acute onset of headache. Lightheaded. Initial encounter.  EXAM: CT HEAD WITHOUT CONTRAST  TECHNIQUE: Contiguous axial images were obtained from the base of the skull through the vertex without intravenous contrast.  COMPARISON:  MRI of the brain performed 09/06/2011  FINDINGS: There is no evidence of acute infarction, mass lesion, or intra- or extra-axial hemorrhage on CT.  Scattered periventricular and subcortical white matter change likely reflects small vessel ischemic microangiopathy. A chronic lacunar infarct is seen at the high right frontal lobe.  The posterior fossa, including the cerebellum, brainstem and fourth ventricle, is within normal limits. The third and lateral  ventricles, and basal ganglia are unremarkable in appearance. No mass effect or midline shift is seen.  There is no evidence of fracture; visualized osseous structures are unremarkable in appearance. The visualized portions of the orbits are within normal limits. There is mild partial opacification of the left side of the sphenoid sinus. The remaining paranasal sinuses and mastoid air cells are well-aerated. No significant soft tissue abnormalities are seen.  IMPRESSION: 1. No acute intracranial pathology seen on CT. 2. Scattered small vessel ischemic microangiopathy. 3. 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 4. Mild partial opacification of the left side of the sphenoid sinus.   Electronically Signed   By: Roanna RaiderJeffery  Chang M.D.   On: 07/10/2014 02:24      Labs: Basic Metabolic Panel:  Recent Labs Lab 07/10/14 0055  NA 139  K 3.8  CL 107  CO2 27  GLUCOSE 127*  BUN 16  CREATININE 1.04  CALCIUM 8.8   Liver Function Tests:  Recent Labs Lab 07/10/14 0055  AST 32  ALT 16  ALKPHOS 52  BILITOT 0.5  PROT 7.3  ALBUMIN 3.8   CBC:  Recent Labs Lab 07/10/14 0055  WBC 6.7  NEUTROABS 4.8  HGB 11.7*  HCT 35.6*  MCV 87.5  PLT 215   Cardiac Enzymes:  Recent Labs Lab 07/10/14 0055  TROPONINI <0.03    Principal Problem:   Vertigo Active Problems:   HTN (hypertension)   Peripheral vertigo   Time coordinating discharge: 35 minutes  Signed:  Brendia Sacksaniel Eldridge Marcott, MD [facility] 07/10/2014, 5:12 PM

## 2014-07-16 DIAGNOSIS — H8113 Benign paroxysmal vertigo, bilateral: Secondary | ICD-10-CM | POA: Diagnosis not present

## 2014-07-16 DIAGNOSIS — M25561 Pain in right knee: Secondary | ICD-10-CM | POA: Diagnosis not present

## 2014-08-17 DIAGNOSIS — M25561 Pain in right knee: Secondary | ICD-10-CM | POA: Diagnosis not present

## 2014-09-11 DIAGNOSIS — I10 Essential (primary) hypertension: Secondary | ICD-10-CM | POA: Diagnosis not present

## 2014-09-11 DIAGNOSIS — E784 Other hyperlipidemia: Secondary | ICD-10-CM | POA: Diagnosis not present

## 2014-09-11 DIAGNOSIS — H2510 Age-related nuclear cataract, unspecified eye: Secondary | ICD-10-CM | POA: Diagnosis not present

## 2014-09-11 DIAGNOSIS — H5210 Myopia, unspecified eye: Secondary | ICD-10-CM | POA: Diagnosis not present

## 2014-09-11 DIAGNOSIS — H57059 Tonic pupil, unspecified eye: Secondary | ICD-10-CM | POA: Diagnosis not present

## 2014-09-14 DIAGNOSIS — M25561 Pain in right knee: Secondary | ICD-10-CM | POA: Diagnosis not present

## 2014-09-25 ENCOUNTER — Ambulatory Visit (HOSPITAL_COMMUNITY): Payer: Commercial Managed Care - HMO | Attending: Orthopaedic Surgery | Admitting: Physical Therapy

## 2014-09-25 DIAGNOSIS — M25561 Pain in right knee: Secondary | ICD-10-CM | POA: Diagnosis not present

## 2014-09-25 DIAGNOSIS — M545 Low back pain: Secondary | ICD-10-CM | POA: Diagnosis not present

## 2014-09-25 DIAGNOSIS — M6281 Muscle weakness (generalized): Secondary | ICD-10-CM | POA: Insufficient documentation

## 2014-09-25 DIAGNOSIS — M25652 Stiffness of left hip, not elsewhere classified: Secondary | ICD-10-CM

## 2014-09-25 DIAGNOSIS — M25651 Stiffness of right hip, not elsewhere classified: Secondary | ICD-10-CM | POA: Diagnosis not present

## 2014-09-25 DIAGNOSIS — M6289 Other specified disorders of muscle: Secondary | ICD-10-CM | POA: Insufficient documentation

## 2014-09-25 DIAGNOSIS — R2689 Other abnormalities of gait and mobility: Secondary | ICD-10-CM | POA: Insufficient documentation

## 2014-09-25 DIAGNOSIS — R293 Abnormal posture: Secondary | ICD-10-CM | POA: Diagnosis not present

## 2014-09-25 DIAGNOSIS — R29898 Other symptoms and signs involving the musculoskeletal system: Secondary | ICD-10-CM

## 2014-09-25 DIAGNOSIS — R269 Unspecified abnormalities of gait and mobility: Secondary | ICD-10-CM

## 2014-09-25 NOTE — Therapy (Signed)
[facility] [address] [place], [place], [address] Phone: [phone]   Fax:  [phone]  Physical Therapy Evaluation  Patient Details  Name: Jeremiah Murphy MRN: [id number] Date of Birth: 1941-03-01 Referring Provider:  Kathryne HitchBlackman, Christopher Y*  Encounter Date: 09/25/2014      PT End of Session - 09/25/14 1220    Visit Number 1   Number of Visits 16   Date for PT Re-Evaluation 10/23/14   Authorization Type [employer]   Authorization Time Period 09/25/14 to 11/25/14   Authorization - Visit Number 1   Authorization - Number of Visits 10   PT Start Time 1105   PT Stop Time 1145   PT Time Calculation (min) 40 min   Activity Tolerance Patient tolerated treatment well   Behavior [facility] for tasks assessed/performed      Past Medical History  Diagnosis Date  . HTN (hypertension)   . Dyslipidemia   . Normocytic anemia     Past Surgical History  Procedure Laterality Date  . Carpal tunnel release  1970    left    There were no vitals filed for this visit.  Visit Diagnosis:  Bilateral low back pain, with sciatica presence unspecified - Plan: PT plan of care cert/re-cert  Right knee pain - Plan: PT plan of care cert/re-cert  Abnormality of gait - Plan: PT plan of care cert/re-cert  Leg weakness, bilateral - Plan: PT plan of care cert/re-cert  Proximal muscle weakness - Plan: PT plan of care cert/re-cert  Poor posture - Plan: PT plan of care cert/re-cert  Poor balance - Plan: PT plan of care cert/re-cert  Hip stiffness, left - Plan: PT plan of care cert/re-cert  Hip stiffness, right - Plan: PT plan of care cert/re-cert      Subjective Assessment - 09/25/14 1108    Subjective Has intermittent pain in his back and knee. Doesn't really notice if it keeps him from doing anything during the day, but mostly has pain during the evening.    Pertinent History Patient states that the pain in  his back and knee in the fall of 2015; [place] then went back to work in [employer] and came home with whole R side bruised. Never went to MD to get checked out; just last year started to complain of more pain in back and knee  as well as edema in R foot. Having pain on inner side of knee.    How long can you sit comfortably? Uncomfortable but can stay sitting down for 5-6 hours watching TV    How long can you stand comfortably? 60 minutes    How long can you walk comfortably? 30 minutes    Patient Stated Goals get rid of pain in back and leg    Currently in Pain? No/denies  at worst, 5/10             [facility] Assessment - 09/25/14 0001    Assessment   Medical Diagnosis LBP and R knee pain    Onset Date/Surgical Date --  fall 2015   [facility]    Precautions   Precautions None   Restrictions   Weight Bearing Restrictions No   Balance Screen   Has the patient fallen in the past 6 months No   Has the patient had a decrease in activity level because of a fear of falling?  Yes   Is the  patient reluctant to leave their home because of a fear of falling?  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[employer], relaxing, Advertising copywriter fishing, going to [employer]    Observation/Other [employer] on Therapeutic Outcomes (FOTO)  30% limited for lumbar spine    Posture/Postural Control   Posture Comments wide BOS, pronated feet, flat spinal curves, R shoulder lower than L    AROM   Overall AROM Comments no significant LLD noted today    Right Hip External Rotation  45   Right Hip Internal Rotation  22   Left Hip External Rotation  47   Left Hip Internal Rotation  27   Lumbar Flexion 58   Lumbar Extension 20   Lumbar - Right Side Bend 28   Lumbar - Left Side Bend 30   Lumbar - Right Rotation --  approx 40% limited    Lumbar - Left Rotation --  approx 40% limited     Strength   Overall Strength Comments Upper abs approx 3-/5, lower approx 3+/5   Right Hip Flexion 4/5   Right Hip Extension 2/5   Right Hip ABduction 3+/5   Left Hip Flexion 4/5   Left Hip Extension 2/5   Left Hip ABduction 3-/5   Right Knee Flexion 4+/5   Right Knee Extension 4-/5   Left Knee Flexion 3+/5   Left Knee Extension 4+/5   Right Ankle Dorsiflexion 5/5   Left Ankle Dorsiflexion 5/5   Flexibility   Hamstrings tightness noted    Ambulation/Gait   Gait Comments strong pronation of B feet, collapsing of arches, reduced rotation of hips and trunk, apparent LLD, valgus ankles B                            PT Education - 09/25/14 1220    Education provided Yes   Education Details prognosis, plan of care moving forward, HEP    Person(s) Educated Patient   Methods Explanation;Handout;Demonstration   Comprehension Verbalized understanding;Need further instruction;Returned demonstration          PT Short Term Goals - 09/25/14 1230    PT SHORT TERM GOAL #1   Title Patient will demonstrate an improvement of at least 10 degrees in all lumbar plans of motion and bilateral hip IR, as well as improvement of lumbar rotation by at least 25%    Time 4   Period Weeks   Status New   PT SHORT TERM GOAL #2   Title Patient will demonstrate bilateral lower extremity strength of at least 4+/5 and proximal muscle/core strength of at least 4/5    Time 4   Period Weeks   Status New   PT SHORT TERM GOAL #3   Title Patient will experience no more than 2/10 pain at worst when it does occur in his R knee and low back    Time 4   Period Weeks   Status New   PT SHORT TERM GOAL #4   Title Patient will be able to verbalize the importance of maintaining good siting and standing posture, and will be able to maintain good posture at least 80% of the time during all functional tasks and activities    Time 4   Period Weeks   Status New           PT Long Term Goals -  09/25/14  1233    PT LONG TERM GOAL #1   Title Patient will be independent in correctly and consistently performing appropriate HEP, to be advanced PRN    Time 8   Period Weeks   Status New   PT LONG TERM GOAL #2   Title Patient will demonstrate correct form during all lifting tasks in order to reduce chances of recurrence of or exacerbation of pain    Time 8   Period Weeks   Status New   PT LONG TERM GOAL #3   Title Patient will demonstrate lumbar spine and bilateral hip ROM WNL for all planes measured   Time 8   Period Weeks   Status New   PT LONG TERM GOAL #4   Title Patient will experience 0/10 pain in his low back and R knee  during all functional tasks and activities    Time 8   Period Weeks   Status New               Plan - 2014-10-16 1225    Clinical Impression Statement Patient presents with pain in low back and R knee, postural and gait impairments, muscle weakness in B LEs and proximal musculature, poor balance, stiffness of lumbar spine and bilateral hips, and reduced functional task performance skills. Patient states that his pain initially started years ago after a fall at work, then went away, but really started getting bad again last fall. Patient  reports that it is primarily his medial R knee that hurts. Patient will benefit from skilled PT services in order to address these deficits and assist him in reachign an otptimal level of function.    Pt will benefit from skilled therapeutic intervention in order to improve on the following deficits Abnormal gait;Hypomobility;Decreased activity tolerance;Decreased strength;Pain;Decreased balance;Decreased mobility;Difficulty walking;Decreased range of motion;Improper body mechanics;Decreased coordination;Impaired flexibility;Postural dysfunction   Rehab Potential Good   PT Frequency 2x / week   PT Duration 8 weeks   PT Treatment/Interventions ADLs/Self Care Home Management;Gait training;Stair training;Functional mobility  training;Therapeutic activities;Therapeutic exercise;Balance training;Neuromuscular re-education;Patient/family education;Orthotic Fit/Training;Taping   PT Next Visit Plan review HEP and goals; functional stretching and exercises; check for LLD    PT Home Exercise Plan given    Consulted and Agree with Plan of Care Patient          G-Codes - Oct 16, 2014 1236    Functional Assessment Tool Used FOTO    Functional Limitation Mobility: Walking and moving around   Mobility: Walking and Moving Around Current Status [phone]) At least 20 percent but less than 40 percent impaired, limited or restricted   Mobility: Walking and Moving Around Goal Status [phone]) At least 1 percent but less than 20 percent impaired, limited or restricted       Problem List Patient Active Problem List   Diagnosis Date Noted  . Vertigo 07/10/2014  . HTN (hypertension) 07/10/2014  . Peripheral vertigo 07/10/2014  . Neurogenic pain, leg 01/19/2014  . Normocytic anemia 06/24/2013  . GANGLION CYST 05/24/2010   Nedra Hai PT, DPT [id number]  [facility] [address] [place], [place], [address] Phone: [phone]   Fax:  [phone]

## 2014-09-25 NOTE — Patient Instructions (Signed)
   BRIDGING  While lying on your back, tighten your lower abdominals, squeeze your buttocks and then raise your buttocks off the floor/bed as creating a "Bridge" with your body. Repeat 10x, twice a day.     Lumbar Rotations   Lying on your back with your knees bent, slowly drop your legs to one side and hold the stretch. Come back to the middle and switch sides. You should feel the stretch in your back on the opposite side that your legs are leaning. Complete this exercise 10 times with five second holds.     ELASTIC BAND - HIP EXTENSION  While standing holding onto a chair or the kitchen counter, draw your leg back behind you.   Keep your knee straight the entire time. You do not need to use a band for this exercise yet.  Do 10x, twice a day.   Functional Quadriceps: Sit to Stand   Sit on edge of chair, feet flat on floor. Stand upright, extending knees fully. Do not use your hands to help you stand up. Repeat _10___ times per set. Do __1__ sets per session. Do _2___ sessions per day.  http://orth.exer.us/734   Copyright  VHI. All rights reserved.

## 2014-09-30 ENCOUNTER — Ambulatory Visit (HOSPITAL_COMMUNITY): Payer: Commercial Managed Care - HMO | Admitting: Physical Therapy

## 2014-09-30 DIAGNOSIS — M6281 Muscle weakness (generalized): Secondary | ICD-10-CM

## 2014-09-30 DIAGNOSIS — M25561 Pain in right knee: Secondary | ICD-10-CM | POA: Diagnosis not present

## 2014-09-30 DIAGNOSIS — R2689 Other abnormalities of gait and mobility: Secondary | ICD-10-CM | POA: Diagnosis not present

## 2014-09-30 DIAGNOSIS — R29898 Other symptoms and signs involving the musculoskeletal system: Secondary | ICD-10-CM

## 2014-09-30 DIAGNOSIS — M545 Low back pain: Secondary | ICD-10-CM

## 2014-09-30 DIAGNOSIS — R293 Abnormal posture: Secondary | ICD-10-CM | POA: Diagnosis not present

## 2014-09-30 DIAGNOSIS — M25651 Stiffness of right hip, not elsewhere classified: Secondary | ICD-10-CM | POA: Diagnosis not present

## 2014-09-30 DIAGNOSIS — M6289 Other specified disorders of muscle: Secondary | ICD-10-CM | POA: Diagnosis not present

## 2014-09-30 DIAGNOSIS — R269 Unspecified abnormalities of gait and mobility: Secondary | ICD-10-CM

## 2014-09-30 DIAGNOSIS — M25652 Stiffness of left hip, not elsewhere classified: Secondary | ICD-10-CM | POA: Diagnosis not present

## 2014-09-30 NOTE — Patient Instructions (Signed)
Knee Flexion: Resisted (Sitting)   Sit with band under left foot and looped around ankle of supported leg. Pull unsupported leg back. Repeat _10__ times per set. Do _2__ sets per session. Do __2__ sessions per day.  http://orth.exer.us/694   Copyright  VHI. All rights reserved.  KNEE: Extension, Long Arc Quad (Band)   Place band around leg and under other foot. Pull band forward until knee is straight. Hold _5__ seconds. Use band. _10__ reps per set, _2_ sets, do 2 times per day. Copyright  VHI. All rights reserved.  Clam Shell 45 Degrees   Lying with hips and knees bent 45, one pillow between knees and ankles. Lift knee. Be sure pelvis does not roll backward. Do not arch back. Do _10__ times, each leg, _2__ times per day.  http://ss.exer.us/74   Copyright  VHI. All rights reserved.

## 2014-09-30 NOTE — Therapy (Signed)
[facility] [address] [place], [place], [address] Phone: [phone]   Fax:  [phone]  Physical Therapy Treatment  Patient Details  Name: Jeremiah Murphy MRN: [id number] Date of Birth: 10-08-1940 Referring Provider:  Kathryne HitchBlackman, Christopher Y*  Encounter Date: 09/30/2014      PT End of Session - 09/30/14 1008    Visit Number 2   Number of Visits 16   Date for PT Re-Evaluation 10/23/14   Authorization Type [employer] Advantage   Authorization Time Period 09/25/14 to 11/25/14   Authorization - Visit Number 2   Authorization - Number of Visits 10   PT Start Time 0845   PT Stop Time 0925   PT Time Calculation (min) 40 min      Past Medical History  Diagnosis Date  . HTN (hypertension)   . Dyslipidemia   . Normocytic anemia     Past Surgical History  Procedure Laterality Date  . Carpal tunnel release  1970    left    There were no vitals filed for this visit.  Visit Diagnosis:  Bilateral low back pain, with sciatica presence unspecified  Right knee pain  Abnormality of gait  Leg weakness, bilateral  Proximal muscle weakness      Subjective Assessment - 09/30/14 0855    Subjective Been working on exercises at home now 2 X per day. Having some numbness in rt foot still.    Currently in Pain? Yes   Pain Score 4    Pain Location Knee   Pain Orientation Right                         [facility] Adult PT Treatment/Exercise - 09/30/14 0001    Lumbar Exercises: Stretches   Lower Trunk Rotation 5 reps;10 seconds   Lower Trunk Rotation Limitations tactile cues   Lumbar Exercises: Supine   Bridge 10 reps;2 seconds   Bridge Limitations not full range   Lumbar Exercises: Sidelying   Clam 10 reps;3 seconds   Clam Limitations red band loop, limited range   Knee/Hip Exercises: Standing   Functional Squat 1 set;10 reps   Other Standing Knee Exercises standing hip extension, review of technique  for HEP   Knee/Hip Exercises: Seated   Long Arc Quad Right;2 sets;10 reps   Long Arc Quad Limitations red band loop   Other Seated Knee Exercises seated knee flexion with red band loop, 2 X 10                PT Education - 09/30/14 1007    Education provided Yes   Education Details Reviewed prior home program and advanced with strengthening exercises. Instructed to stop any exercise if it was causing increased pain.    Person(s) Educated Patient   Methods Explanation;Demonstration;Tactile cues;Verbal cues;Handout   Comprehension Verbalized understanding          PT Short Term Goals - 09/25/14 1230    PT SHORT TERM GOAL #1   Title Patient will demonstrate an improvement of at least 10 degrees in all lumbar plans of motion and bilateral hip IR, as well as improvement of lumbar rotation by at least 25%    Time 4   Period Weeks   Status New   PT SHORT TERM GOAL #2   Title Patient will demonstrate bilateral lower extremity strength of at least 4+/5 and proximal muscle/core strength of at least 4/5    Time 4  Period Weeks   Status New   PT SHORT TERM GOAL #3   Title Patient will experience no more than 2/10 pain at worst when it does occur in his R knee and low back    Time 4   Period Weeks   Status New   PT SHORT TERM GOAL #4   Title Patient will be able to verbalize the importance of maintaining good siting and standing posture, and will be able to maintain good posture at least 80% of the time during all functional tasks and activities    Time 4   Period Weeks   Status New           PT Long Term Goals - 09/25/14 1233    PT LONG TERM GOAL #1   Title Patient will be independent in correctly and consistently performing appropriate HEP, to be advanced PRN    Time 8   Period Weeks   Status New   PT LONG TERM GOAL #2   Title Patient will demonstrate correct form during all lifting tasks in order to reduce chances of recurrence of or exacerbation of pain    Time 8    Period Weeks   Status New   PT LONG TERM GOAL #3   Title Patient will demonstrate lumbar spine and bilateral hip ROM WNL for all planes measured   Time 8   Period Weeks   Status New   PT LONG TERM GOAL #4   Title Patient will experience 0/10 pain in his low back and R knee  during all functional tasks and activities    Time 8   Period Weeks   Status New               Plan - 09/30/14 1009    Clinical Impression Statement Able to progress patient's home program. Patient reports feeling better after session. Noted decreased strength in LE's which we addressed with the home program. Patient is going to continue to use home and gym based programs .    Rehab Potential Good   PT Frequency 2x / week   PT Next Visit Plan Review again and progress home program with core and lower extremity exercises. Consider more closed chain exercises such as step up at next session.    PT Home Exercise Plan provided, review and modify as needed.    Consulted and Agree with Plan of Care Patient        Problem List Patient Active Problem List   Diagnosis Date Noted  . Vertigo 07/10/2014  . HTN (hypertension) 07/10/2014  . Peripheral vertigo 07/10/2014  . Neurogenic pain, leg 01/19/2014  . Normocytic anemia 06/24/2013  . GANGLION CYST 05/24/2010    Christiane Ha, PT, CSCS  09/30/2014, 10:13 AM  [facility] [address] [place], [place], [address] Phone: [phone]   Fax:  [phone]

## 2014-10-12 DIAGNOSIS — E782 Mixed hyperlipidemia: Secondary | ICD-10-CM | POA: Diagnosis not present

## 2014-10-12 DIAGNOSIS — I10 Essential (primary) hypertension: Secondary | ICD-10-CM | POA: Diagnosis not present

## 2014-10-14 DIAGNOSIS — E785 Hyperlipidemia, unspecified: Secondary | ICD-10-CM | POA: Diagnosis not present

## 2014-10-14 DIAGNOSIS — D649 Anemia, unspecified: Secondary | ICD-10-CM | POA: Diagnosis not present

## 2014-10-14 DIAGNOSIS — M25569 Pain in unspecified knee: Secondary | ICD-10-CM | POA: Diagnosis not present

## 2014-10-14 DIAGNOSIS — I10 Essential (primary) hypertension: Secondary | ICD-10-CM | POA: Diagnosis not present

## 2014-10-15 ENCOUNTER — Ambulatory Visit (HOSPITAL_COMMUNITY): Payer: Commercial Managed Care - HMO

## 2014-10-15 DIAGNOSIS — R29898 Other symptoms and signs involving the musculoskeletal system: Secondary | ICD-10-CM

## 2014-10-15 DIAGNOSIS — M25561 Pain in right knee: Secondary | ICD-10-CM

## 2014-10-15 DIAGNOSIS — M25651 Stiffness of right hip, not elsewhere classified: Secondary | ICD-10-CM | POA: Diagnosis not present

## 2014-10-15 DIAGNOSIS — R2689 Other abnormalities of gait and mobility: Secondary | ICD-10-CM | POA: Diagnosis not present

## 2014-10-15 DIAGNOSIS — M545 Low back pain: Secondary | ICD-10-CM | POA: Diagnosis not present

## 2014-10-15 DIAGNOSIS — M25652 Stiffness of left hip, not elsewhere classified: Secondary | ICD-10-CM

## 2014-10-15 DIAGNOSIS — R269 Unspecified abnormalities of gait and mobility: Secondary | ICD-10-CM

## 2014-10-15 DIAGNOSIS — R293 Abnormal posture: Secondary | ICD-10-CM | POA: Diagnosis not present

## 2014-10-15 DIAGNOSIS — M6281 Muscle weakness (generalized): Secondary | ICD-10-CM | POA: Diagnosis not present

## 2014-10-15 DIAGNOSIS — M6289 Other specified disorders of muscle: Secondary | ICD-10-CM | POA: Diagnosis not present

## 2014-10-15 NOTE — Patient Instructions (Signed)
Stabilization: Transverse Abdominus Contraction - Supine   Lying on bed with knees bent, pull belly toward spine. You should feel a gentle contraction down.  Copyright  VHI. All rights reserved.   Seated Stretch: Rounding Lower Body   With feet on floor, pillows in lap, hold chair and slowly curl forward. Bend until a stretch is felt the the lower back. Come back up slowly. Exhale while bending, inhale while rising. Breathe normally throughout. Repeat 3 times for 60 seconds. Do 3-5x sessions per day based on pay.  http://gt2.exer.us/804   Copyright  VHI. All rights reserved.  Double Knee to Chest (Flexion)   Gently pull both knees toward chest. Feel stretch in lower back or buttock area. Breathing deeply, Hold 1 second. Repeat 15 times. Do 3-5 sessions per day.  http://gt2.exer.us/227   Copyright  VHI. All rights reserved.

## 2014-10-15 NOTE — Therapy (Signed)
[facility] [address] [place], [place], [address] Phone: [phone]   Fax:  [phone]  Physical Therapy Treatment  Patient Details  Name: Jeremiah Murphy MRN: [id number] Date of Birth: 02/12/1941 Referring Provider:  Catalina Pizza, MD  Encounter Date: 10/15/2014      PT End of Session - 10/15/14 1450    Visit Number 3   Number of Visits 16   Date for PT Re-Evaluation 10/23/14   Authorization Type [employer] H[employer] Advantage   Authorization Time Period 09/25/14 to 11/25/14   Authorization - Visit Number 3   Authorization - Number of Visits 10   PT Start Time 1258   PT Stop Time 1349   PT Time Calculation (min) 51 min   Activity Tolerance Patient tolerated treatment well  Decrease in pain with activity.    Behavior During Therapy [facility] for tasks assessed/performed      Past Medical History  Diagnosis Date  . HTN (hypertension)   . Dyslipidemia   . Normocytic anemia     Past Surgical History  Procedure Laterality Date  . Carpal tunnel release  1970    left    There were no vitals filed for this visit.  Visit Diagnosis:  Bilateral low back pain, with sciatica presence unspecified  Right knee pain  Hip stiffness, right  Abnormality of gait  Leg weakness, bilateral  Proximal muscle weakness  Poor posture  Poor balance  Hip stiffness, left      Subjective Assessment - 10/15/14 1302    Subjective Pt reports he is still working on HEP, but that it makes his pain worse.    Pertinent History Patient states that the pain in his back and knee in the fall of 2015; patient retired in [place] then went back to work in [employer] and came home with whole R side bruised. Never went to [place] to get checked out; just last year started to complain of more pain in back and knee  as well as edema in R foot. Having pain on inner side of knee.    Currently in Pain? Yes   Pain Score 7    Pain Location Back  Also right knee    Pain Orientation Right   Pain Type Chronic pain   Pain Radiating Towards Knee and proximal calf    Aggravating Factors  HEP, especially knee extension c TB    Multiple Pain Sites --  R knee, calf, lowback .                          [facility] Adult PT Treatment/Exercise - 10/15/14 1304    Lumbar Exercises: Stretches   Single Knee to Chest Stretch 3 reps;30 seconds  bilat    Single Knee to Chest Stretch Limitations --  mild discomfort in R hip, back pain not agravated.    Double Knee to Chest Stretch Other (comment)  gentle rocking x50   Double Knee to Chest Stretch Limitations pt reporting improves back pain c DKTC AROM   Lower Trunk Rotation 5 reps;10 seconds   Lower Trunk Rotation Limitations tactile cues   Lumbar Exercises: Seated   Other Seated Lumbar Exercises seated lumbar flexion stretch   1x 3 minutes, 2 pillows in lap   Lumbar Exercises: Supine   Clam 15 reps  2 sets   Clam Limitations 2x15 c red TB   Heel Slides 10 reps  c BP cuff under lumbar, Transverse  contraction <1mmHg chng   Heel Slides Limitations Bilat   Other Supine Lumbar Exercises Transverse Abdominus Activation  Hooklying 10x3 seconds, using    Other Supine Lumbar Exercises Pelvic Clock  12x ant tilt/post tilt   Lumbar Exercises: Sidelying   Clam 10 reps;3 seconds   Clam Limitations red band loop, limited range                PT Education - 10/15/14 1449    Education provided Yes   Education Details DC previous HEP until further notice; utilize current HEP ad lib to improve flare-up of current back pain Sx.    Person(s) Educated Patient   Methods Explanation;Demonstration   Comprehension Verbalized understanding          PT Short Term Goals - 09/25/14 1230    PT SHORT TERM GOAL #1   Title Patient will demonstrate an improvement of at least 10 degrees in all lumbar plans of motion and bilateral hip IR, as well as improvement of lumbar rotation by at least 25%    Time 4    Period Weeks   Status New   PT SHORT TERM GOAL #2   Title Patient will demonstrate bilateral lower extremity strength of at least 4+/5 and proximal muscle/core strength of at least 4/5    Time 4   Period Weeks   Status New   PT SHORT TERM GOAL #3   Title Patient will experience no more than 2/10 pain at worst when it does occur in his R knee and low back    Time 4   Period Weeks   Status New   PT SHORT TERM GOAL #4   Title Patient will be able to verbalize the importance of maintaining good siting and standing posture, and will be able to maintain good posture at least 80% of the time during all functional tasks and activities    Time 4   Period Weeks   Status New           PT Long Term Goals - 09/25/14 1233    PT LONG TERM GOAL #1   Title Patient will be independent in correctly and consistently performing appropriate HEP, to be advanced PRN    Time 8   Period Weeks   Status New   PT LONG TERM GOAL #2   Title Patient will demonstrate correct form during all lifting tasks in order to reduce chances of recurrence of or exacerbation of pain    Time 8   Period Weeks   Status New   PT LONG TERM GOAL #3   Title Patient will demonstrate lumbar spine and bilateral hip ROM WNL for all planes measured   Time 8   Period Weeks   Status New   PT LONG TERM GOAL #4   Title Patient will experience 0/10 pain in his low back and R knee  during all functional tasks and activities    Time 8   Period Weeks   Status New               Plan - 10/15/14 1455    Clinical Impression Statement Pt tolerating treatment session well, motivated and able to complete entire PT session as planned. Exercise program today modified to include more flexion-oriented activates and avoidance of extension, which seems to have improved symptoms almost immediately. Initiated pt on new core stability program using BP cuff to improve pelvic stability during lower limb excursion c trA contraction.  Pt  continues  to make progress toward goals as evidenced by improved pain management and HEP compliance.  Pt's greatest limitation continues to ability to perform HEP s exacerbation in Sx, which was modified today. Patient presenting with impairment of strength, pain, range of motion, balance, and activity tolerance, limiting ability to perform ADL and mobility tasks at  baseline level of function. Patient will benefit from skilled intervention to address the above impairments and limitations, in order to restore to prior level of function, improve patient safety upon discharge, and to decrease caregiver burden.   Pt will benefit from skilled therapeutic intervention in order to improve on the following deficits Abnormal gait;Hypomobility;Decreased activity tolerance;Decreased strength;Pain;Decreased balance;Decreased mobility;Difficulty walking;Decreased range of motion;Improper body mechanics;Decreased coordination;Impaired flexibility;Postural dysfunction   Rehab Potential Good   PT Frequency 2x / week   PT Duration 8 weeks   PT Treatment/Interventions ADLs/Self Care Home Management;Gait training;Stair training;Functional mobility training;Therapeutic activities;Therapeutic exercise;Balance training;Neuromuscular re-education;Patient/family education;Orthotic Fit/Training;Taping   PT Next Visit Plan Review again and progress transverse abd contractions to 10 secon holds, and include more single LE movement with BP cuff feedback.    PT Home Exercise Plan DC of previous program and given updates.    Consulted and Agree with Plan of Care Patient        Problem List Patient Active Problem List   Diagnosis Date Noted  . Vertigo 07/10/2014  . HTN (hypertension) 07/10/2014  . Peripheral vertigo 07/10/2014  . Neurogenic pain, leg 01/19/2014  . Normocytic anemia 06/24/2013  . GANGLION CYST 05/24/2010    Buccola,Allan C 10/15/2014, 3:01 PM  3:02 PM  Rosamaria Lints, PT, DPT [place] License #  [id number]     [facility] [address] [place], [place], [address] Phone: [phone]   Fax:  [phone]

## 2014-10-16 ENCOUNTER — Ambulatory Visit (HOSPITAL_COMMUNITY): Payer: Commercial Managed Care - HMO | Admitting: Physical Therapy

## 2014-10-16 DIAGNOSIS — M6281 Muscle weakness (generalized): Secondary | ICD-10-CM

## 2014-10-16 DIAGNOSIS — R269 Unspecified abnormalities of gait and mobility: Secondary | ICD-10-CM

## 2014-10-16 DIAGNOSIS — M25651 Stiffness of right hip, not elsewhere classified: Secondary | ICD-10-CM | POA: Diagnosis not present

## 2014-10-16 DIAGNOSIS — M6289 Other specified disorders of muscle: Secondary | ICD-10-CM | POA: Diagnosis not present

## 2014-10-16 DIAGNOSIS — M25561 Pain in right knee: Secondary | ICD-10-CM | POA: Diagnosis not present

## 2014-10-16 DIAGNOSIS — R2689 Other abnormalities of gait and mobility: Secondary | ICD-10-CM | POA: Diagnosis not present

## 2014-10-16 DIAGNOSIS — M545 Low back pain: Secondary | ICD-10-CM | POA: Diagnosis not present

## 2014-10-16 DIAGNOSIS — M25652 Stiffness of left hip, not elsewhere classified: Secondary | ICD-10-CM | POA: Diagnosis not present

## 2014-10-16 DIAGNOSIS — R293 Abnormal posture: Secondary | ICD-10-CM | POA: Diagnosis not present

## 2014-10-16 NOTE — Therapy (Signed)
[facility] [address] [place], [place], [address] Phone: [phone]   Fax:  [phone]  Physical Therapy Treatment  Patient Details  Name: Jeremiah Murphy MRN: [id number] Date of Birth: 1941-04-17 Referring Provider:  Catalina Pizza, MD  Encounter Date: 10/16/2014      PT End of Session - 10/16/14 1516    Visit Number 4   Number of Visits 16   Date for PT Re-Evaluation 10/23/14   Authorization Type [employer] H[employer] Advantage   Authorization Time Period 09/25/14 to 11/25/14   Authorization - Visit Number 4   Authorization - Number of Visits 10   PT Start Time 1431   PT Stop Time 1514   PT Time Calculation (min) 43 min      Past Medical History  Diagnosis Date  . HTN (hypertension)   . Dyslipidemia   . Normocytic anemia     Past Surgical History  Procedure Laterality Date  . Carpal tunnel release  1970    left    There were no vitals filed for this visit.  Visit Diagnosis:  Bilateral low back pain, with sciatica presence unspecified  Abnormality of gait  Proximal muscle weakness      Subjective Assessment - 10/16/14 1434    Subjective Pt reports that his pain has been decreasing, and it is much better than it was yesterday   Currently in Pain? Yes   Pain Score 5    Pain Location Back             [facility] Adult PT Treatment/Exercise - 10/16/14 0001    Exercises   Exercises Lumbar   Lumbar Exercises: Stretches   Single Knee to Chest Stretch 3 reps;30 seconds  bilat    Double Knee to Chest Stretch 3 reps;30 seconds   Lower Trunk Rotation Limitations 10 reps, 3 seconds   Lumbar Exercises: Seated   Other Seated Lumbar Exercises seated lumbar strecth with large swiss ball roll out- 10x 10 seconds   Lumbar Exercises: Supine   Heel Slides 10 reps  c BP cuff under lumbar, Transverse contraction <68mmHg chng   Bent Knee Raise 10 reps   Bent Knee Raise Limitations with stabilizer cuff, <67mmHg change   Other Supine Lumbar Exercises Transverse Abdominus Activation  Hooklying 10x5 seconds, using stabilizer cuff   Other Supine Lumbar Exercises 15x ant/post pelvic tilt   Lumbar Exercises: Sidelying   Clam 10 reps;3 seconds   Clam Limitations 3 pound weight  pt c/o knee pain with red theraband   Lumbar Exercises: Quadruped   Madcat/Old Horse 10 reps   Madcat/Old Horse Limitations mad cat only   Other Quadruped Lumbar Exercises mulitifidus activation: knee extension without hip extension x 10 bilaterally                PT Education - 10/16/14 1514    Education provided Yes   Education Details Educated on maintaining core contraction during functional activities   Person(s) Educated Patient   Methods Explanation;Demonstration   Comprehension Verbalized understanding          PT Short Term Goals - 09/25/14 1230    PT SHORT TERM GOAL #1   Title Patient will demonstrate an improvement of at least 10 degrees in all lumbar plans of motion and bilateral hip IR, as well as improvement of lumbar rotation by at least 25%    Time 4   Period Weeks   Status New   PT SHORT TERM GOAL #2  Title Patient will demonstrate bilateral lower extremity strength of at least 4+/5 and proximal muscle/core strength of at least 4/5    Time 4   Period Weeks   Status New   PT SHORT TERM GOAL #3   Title Patient will experience no more than 2/10 pain at worst when it does occur in his R knee and low back    Time 4   Period Weeks   Status New   PT SHORT TERM GOAL #4   Title Patient will be able to verbalize the importance of maintaining good siting and standing posture, and will be able to maintain good posture at least 80% of the time during all functional tasks and activities    Time 4   Period Weeks   Status New           PT Long Term Goals - 09/25/14 1233    PT LONG TERM GOAL #1   Title Patient will be independent in correctly and consistently performing appropriate HEP, to be advanced  PRN    Time 8   Period Weeks   Status New   PT LONG TERM GOAL #2   Title Patient will demonstrate correct form during all lifting tasks in order to reduce chances of recurrence of or exacerbation of pain    Time 8   Period Weeks   Status New   PT LONG TERM GOAL #3   Title Patient will demonstrate lumbar spine and bilateral hip ROM WNL for all planes measured   Time 8   Period Weeks   Status New   PT LONG TERM GOAL #4   Title Patient will experience 0/10 pain in his low back and R knee  during all functional tasks and activities    Time 8   Period Weeks   Status New               Plan - 10/16/14 1517    Clinical Impression Statement Pt demonstrated good understanding and carryover of stabilizer cuff exercises. Bent knee raise was added to further challenge pt with stabilizer cuff, and he was able to complete with verbal cueing. Pt continues to respond well to flexion-based exercises, reporting decreased pain post-tx.    PT Next Visit Plan Review HEP again and continue with progression of core stabilization exercises using cuff        Problem List Patient Active Problem List   Diagnosis Date Noted  . Vertigo 07/10/2014  . HTN (hypertension) 07/10/2014  . Peripheral vertigo 07/10/2014  . Neurogenic pain, leg 01/19/2014  . Normocytic anemia 06/24/2013  . GANGLION CYST 05/24/2010    Leona Singleton, PT, DPT [phone] 10/16/2014, 3:20 PM  [facility] [address] [place], [place], [address] Phone: [phone]   Fax:  [phone]

## 2014-10-20 ENCOUNTER — Ambulatory Visit (HOSPITAL_COMMUNITY): Payer: Commercial Managed Care - HMO

## 2014-10-22 ENCOUNTER — Encounter (HOSPITAL_COMMUNITY): Payer: Commercial Managed Care - HMO

## 2014-10-27 ENCOUNTER — Ambulatory Visit (HOSPITAL_COMMUNITY): Payer: Commercial Managed Care - HMO | Attending: Orthopaedic Surgery | Admitting: Physical Therapy

## 2014-10-27 DIAGNOSIS — M25561 Pain in right knee: Secondary | ICD-10-CM | POA: Diagnosis not present

## 2014-10-27 DIAGNOSIS — R2689 Other abnormalities of gait and mobility: Secondary | ICD-10-CM | POA: Insufficient documentation

## 2014-10-27 DIAGNOSIS — M25651 Stiffness of right hip, not elsewhere classified: Secondary | ICD-10-CM | POA: Diagnosis not present

## 2014-10-27 DIAGNOSIS — R29898 Other symptoms and signs involving the musculoskeletal system: Secondary | ICD-10-CM | POA: Diagnosis not present

## 2014-10-27 DIAGNOSIS — M545 Low back pain: Secondary | ICD-10-CM

## 2014-10-27 DIAGNOSIS — M6289 Other specified disorders of muscle: Secondary | ICD-10-CM | POA: Insufficient documentation

## 2014-10-27 DIAGNOSIS — R293 Abnormal posture: Secondary | ICD-10-CM | POA: Insufficient documentation

## 2014-10-27 DIAGNOSIS — R269 Unspecified abnormalities of gait and mobility: Secondary | ICD-10-CM

## 2014-10-27 DIAGNOSIS — M25652 Stiffness of left hip, not elsewhere classified: Secondary | ICD-10-CM | POA: Diagnosis not present

## 2014-10-27 DIAGNOSIS — R2681 Unsteadiness on feet: Secondary | ICD-10-CM | POA: Diagnosis not present

## 2014-10-27 DIAGNOSIS — M6281 Muscle weakness (generalized): Secondary | ICD-10-CM

## 2014-10-27 NOTE — Therapy (Signed)
[place] [facility] [address] , [place], [address] Phone: [phone]   Fax:  [phone]  Physical Therapy Treatment  Patient Details  Name: Jeremiah Murphy MRN: [id number] Date of Birth: Feb 23, 1941 Referring Provider:  Catalina PizzaHall, Zach, MD  Encounter Date: 10/27/2014      PT End of Session - 10/27/14 1500    Visit Number 5   Number of Visits 16   Date for PT Re-Evaluation 10/23/14   Authorization Type [employer] Advantage   Authorization Time Period 09/25/14 to 11/25/14   Authorization - Visit Number 5   Authorization - Number of Visits 10   PT Start Time 1425   PT Stop Time 1505   PT Time Calculation (min) 40 min   Activity Tolerance Patient limited by fatigue      Past Medical History  Diagnosis Date  . HTN (hypertension)   . Dyslipidemia   . Normocytic anemia     Past Surgical History  Procedure Laterality Date  . Carpal tunnel release  1970    left    There were no vitals filed for this visit.  Visit Diagnosis:  Bilateral low back pain, with sciatica presence unspecified  Abnormality of gait  Proximal muscle weakness  Hip stiffness, right  Right knee pain  Leg weakness, bilateral  Poor posture      Subjective Assessment - 10/27/14 1423    Currently in Pain? Yes   Pain Score 5    Pain Location Back                         [facility] Adult PT Treatment/Exercise - 10/27/14 0001    Lumbar Exercises: Stretches   Active Hamstring Stretch 3 reps;30 seconds   Single Knee to Chest Stretch 3 reps;30 seconds   Prone on Elbows Stretch 3 reps;60 seconds   Piriformis Stretch 3 reps;30 seconds   Lumbar Exercises: Standing   Other Standing Lumbar Exercises 3-D hip excursion    Lumbar Exercises: Supine   Ab Set 10 reps   Bent Knee Raise 10 reps   Bridge/SLR 10 reps   Knee/Hip Exercises: Standing   Heel Raises 10 reps   Functional Squat 10 reps                  PT Short  Term Goals - 09/25/14 1230    PT SHORT TERM GOAL #1   Title Patient will demonstrate an improvement of at least 10 degrees in all lumbar plans of motion and bilateral hip IR, as well as improvement of lumbar rotation by at least 25%    Time 4   Period Weeks   Status New   PT SHORT TERM GOAL #2   Title Patient will demonstrate bilateral lower extremity strength of at least 4+/5 and proximal muscle/core strength of at least 4/5    Time 4   Period Weeks   Status New   PT SHORT TERM GOAL #3   Title Patient will experience no more than 2/10 pain at worst when it does occur in his R knee and low back    Time 4   Period Weeks   Status New   PT SHORT TERM GOAL #4   Title Patient will be able to verbalize the importance of maintaining good siting and standing posture, and will be able to maintain good posture at least 80% of the time during all functional tasks and activities    Time  4   Period Weeks   Status New           PT Long Term Goals - 09/25/14 1233    PT LONG TERM GOAL #1   Title Patient will be independent in correctly and consistently performing appropriate HEP, to be advanced PRN    Time 8   Period Weeks   Status New   PT LONG TERM GOAL #2   Title Patient will demonstrate correct form during all lifting tasks in order to reduce chances of recurrence of or exacerbation of pain    Time 8   Period Weeks   Status New   PT LONG TERM GOAL #3   Title Patient will demonstrate lumbar spine and bilateral hip ROM WNL for all planes measured   Time 8   Period Weeks   Status New   PT LONG TERM GOAL #4   Title Patient will experience 0/10 pain in his low back and R knee  during all functional tasks and activities    Time 8   Period Weeks   Status New               Plan - 10/27/14 1506    Clinical Impression Statement Pt exhibits significant fatigue in his LE when completing exercises. Quadriped exercises not done secondary to pt stating that it hurt his knees too much.   Pt needed therapist facilitation to ensure correct technique while completing exercises.   Pt encouraged to wear supportive shoes, ( pt  to department with flip flops on).    PT Next Visit Plan continue to focus on strengthening of core and LE musculature.  Gt train using heel toe gt pattern.         Problem List Patient Active Problem List   Diagnosis Date Noted  . Vertigo 07/10/2014  . HTN (hypertension) 07/10/2014  . Peripheral vertigo 07/10/2014  . Neurogenic pain, leg 01/19/2014  . Normocytic anemia 06/24/2013  . GANGLION CYST 05/24/2010    Virgina Organ, PT CLT [phone] 10/27/2014, 3:14 PM  [facility] [address] [place], [place], [address] Phone: [phone]   Fax:  [phone]

## 2014-10-27 NOTE — Patient Instructions (Signed)
On Elbows (Prone)   Rise up on elbows as high as possible, keeping hips on floor. Hold __3__ minutes  Repeat _1___ times per set. Do _1___ sets per session. Do ___2_ sessions per day.  http://orth.exer.us/92   Copyright  VHI. All rights reserved.  Opposite Arm / Leg Lift (Prone)   Abdomen and head supported, left knee locked, raise leg and opposite arm _2___ inches from floor. Repeat ___10_ times per set. Do __1__ sets per session. Do 2____ sessions per day.  http://orth.exer.us/1114   Copyright  VHI. All rights reserved.  Strengthening: Hip Adduction - Isometric   With ball or folded pillow between knees, squeeze knees together. Hold _5___ seconds. Repeat ___10-5_ times per set. Do ____ sets per session. Do ____ sessions per day.  http://orth.exer.us/612   Copyright  VHI. All rights reserved.  Isometric Abdominal   Lying on back with knees bent, tighten stomach by pressing elbows down. Hold _3-5___ seconds. Repeat 10____ times per set. Do ___1_ sets per session. Do _1___ sessions per day.  http://orth.exer.us/1086   Copyright  VHI. All rights reserved.  Bent Leg Lift (Hook-Lying)   Tighten stomach and slowly raise right leg _3___ inches from floor. Keep trunk rigid. Hold _2___ seconds. Repeat _10___ times per set. Do __1__ sets per session. Do _2___ sessions per day.  http://orth.exer.us/1090   Copyright  VHI. All rights reserved.  Straight Leg Raise   Tighten stomach and slowly raise locked right leg _18___ inches from floor. Repeat __10__ times per set. Do 1____ sets per session. Do __2__ sessions per day.  http://orth.exer.us/1102   Copyright  VHI. All rights reserved.  Stretching: Piriformis (Supine)   Pull right knee toward opposite shoulder. Hold __30__ seconds. Relax. Repeat 3____ times per set. Do ___1_ sets per session. Do 2____ sessions per day.  http://orth.exer.us/712   Copyright  VHI. All rights reserved.

## 2014-10-29 ENCOUNTER — Telehealth (HOSPITAL_COMMUNITY): Payer: Self-pay

## 2014-10-29 ENCOUNTER — Encounter (HOSPITAL_COMMUNITY): Payer: Commercial Managed Care - HMO

## 2014-10-29 ENCOUNTER — Ambulatory Visit (HOSPITAL_COMMUNITY): Payer: Commercial Managed Care - HMO

## 2014-10-29 NOTE — Telephone Encounter (Signed)
He has a dental apptment

## 2014-11-03 ENCOUNTER — Ambulatory Visit (HOSPITAL_COMMUNITY): Payer: Commercial Managed Care - HMO | Admitting: Physical Therapy

## 2014-11-03 DIAGNOSIS — M25651 Stiffness of right hip, not elsewhere classified: Secondary | ICD-10-CM

## 2014-11-03 DIAGNOSIS — M25652 Stiffness of left hip, not elsewhere classified: Secondary | ICD-10-CM | POA: Diagnosis not present

## 2014-11-03 DIAGNOSIS — R269 Unspecified abnormalities of gait and mobility: Secondary | ICD-10-CM

## 2014-11-03 DIAGNOSIS — R29898 Other symptoms and signs involving the musculoskeletal system: Secondary | ICD-10-CM | POA: Diagnosis not present

## 2014-11-03 DIAGNOSIS — M6281 Muscle weakness (generalized): Secondary | ICD-10-CM

## 2014-11-03 DIAGNOSIS — M545 Low back pain: Secondary | ICD-10-CM | POA: Diagnosis not present

## 2014-11-03 DIAGNOSIS — R293 Abnormal posture: Secondary | ICD-10-CM | POA: Diagnosis not present

## 2014-11-03 DIAGNOSIS — M25561 Pain in right knee: Secondary | ICD-10-CM

## 2014-11-03 DIAGNOSIS — R2681 Unsteadiness on feet: Secondary | ICD-10-CM

## 2014-11-03 DIAGNOSIS — M6289 Other specified disorders of muscle: Secondary | ICD-10-CM | POA: Diagnosis not present

## 2014-11-03 NOTE — Therapy (Signed)
[place] [facility] [address] [place], [place], [address] Phone: [phone]   Fax:  [phone]  Physical Therapy Treatment (Re-Assessment)  Patient Details  Name: Jeremiah Murphy MRN: [id number] Date of Birth: 03-22-1941 Referring Provider:  Kathryne Hitch*  Encounter Date: 11/03/2014      PT End of Session - 11/03/14 1524    Visit Number 6   Number of Visits 16   Date for PT Re-Evaluation 12/01/14   Authorization Type [employer] H[employer] Advantage; G-codes done 6th session    Authorization Time Period 09/25/14 to 11/25/14   Authorization - Visit Number 6   Authorization - Number of Visits 16   PT Start Time 1432   PT Stop Time 1514   PT Time Calculation (min) 42 min   Activity Tolerance Patient tolerated treatment well   Behavior During Therapy [facility] for tasks assessed/performed      Past Medical History  Diagnosis Date  . HTN (hypertension)   . Dyslipidemia   . Normocytic anemia     Past Surgical History  Procedure Laterality Date  . Carpal tunnel release  1970    left    There were no vitals filed for this visit.  Visit Diagnosis:  Bilateral low back pain, with sciatica presence unspecified  Abnormality of gait  Proximal muscle weakness  Hip stiffness, right  Right knee pain  Leg weakness, bilateral  Poor posture  Unsteadiness  Hip stiffness, left      Subjective Assessment - 11/03/14 1434    Subjective Patient reports that his pain is getting better overall, but he does still notice it    Pertinent History Patient states that the pain in his back and knee in the fall of 2015; patient retired in [place] then went back to work in [employer] and came home with whole R side bruised. Never went to MD to get checked out; just last year started to complain of more pain in back and knee  as well as edema in R foot. Having pain on inner side of knee.    How long can you sit comfortably? 7/12- Unlimited    How long can you stand comfortably? 7/12- 60 minutes    How long can you walk comfortably? 7/12- 2 hours at max    Patient Stated Goals get rid of pain in back and leg    Currently in Pain? Yes   Pain Score 5    Pain Location --  back and R leg             [facility] PT Assessment - 11/03/14 0001    AROM   Right Hip External Rotation  --  full range    Right Hip Internal Rotation  26   Left Hip External Rotation  --  full range    Left Hip Internal Rotation  35   Lumbar Flexion 63   Lumbar Extension 23   Lumbar - Right Side Bend 28   Lumbar - Left Side Bend 28   Lumbar - Right Rotation 25% limited approx    Lumbar - Left Rotation 25% limited approx    Strength   Overall Strength Comments Upper and lower abs approximately 4/5    Right Hip Flexion 5/5   Right Hip Extension 2+/5   Right Hip ABduction 3+/5   Left Hip Flexion 5/5   Left Hip Extension 3-/5   Left Hip ABduction 3+/5   Right Knee Flexion 4/5   Right Knee Extension  5/5   Left Knee Flexion 4-/5   Left Knee Extension 5/5   Right Ankle Dorsiflexion 5/5   Left Ankle Dorsiflexion 5/5                     [facility] Adult PT Treatment/Exercise - 11/03/14 0001    Knee/Hip Exercises: Stretches   Active Hamstring Stretch Both;2 reps;30 seconds   Active Hamstring Stretch Limitations 12 inch box    Piriformis Stretch Both;2 reps;30 seconds   Piriformis Stretch Limitations seated    Gastroc Stretch Both;2 reps;30 seconds   Gastroc Stretch Limitations slantboard    Knee/Hip Exercises: Aerobic   Stationary Bike Nustep hills 4 level 2 x8 minutes                 PT Education - 11/03/14 1523    Education provided Yes   Education Details progress with skilled PT services, plan of care moving forward    Person(s) Educated Patient   Methods Explanation   Comprehension Verbalized understanding          PT Short Term Goals - 11/03/14 1459    PT SHORT TERM GOAL #1   Title Patient will demonstrate an  improvement of at least 10 degrees in all lumbar plans of motion and bilateral hip IR, as well as improvement of lumbar rotation by at least 25%    Time 4   Period Weeks   Status On-going   PT SHORT TERM GOAL #2   Title Patient will demonstrate bilateral lower extremity strength of at least 4+/5 and proximal muscle/core strength of at least 4/5    Time 4   Period Weeks   Status On-going   PT SHORT TERM GOAL #3   Title Patient will experience no more than 2/10 pain at worst when it does occur in his R knee and low back    Time 4   Period Weeks   Status On-going   PT SHORT TERM GOAL #4   Title Patient will be able to verbalize the importance of maintaining good siting and standing posture, and will be able to maintain good posture at least 80% of the time during all functional tasks and activities    Time 4   Period Weeks   Status On-going           PT Long Term Goals - 11/03/14 1502    PT LONG TERM GOAL #1   Title Patient will be independent in correctly and consistently performing appropriate HEP, to be advanced PRN    Time 8   Period Weeks   Status Achieved   PT LONG TERM GOAL #2   Title Patient will demonstrate correct form during all lifting tasks in order to reduce chances of recurrence of or exacerbation of pain    Time 8   Period Weeks   Status On-going   PT LONG TERM GOAL #3   Title Patient will demonstrate lumbar spine and bilateral hip ROM WNL for all planes measured   Time 8   Period Weeks   Status On-going   PT LONG TERM GOAL #4   Title Patient will experience 0/10 pain in his low back and R knee  during all functional tasks and activities    Time 8   Period Weeks   Status On-going               Plan - 11/03/14 1525    Clinical Impression Statement Re-assessment performed today. Patient continues to  demonstrate stiffness in bilateral hips and lumbar spine, bilateral LE and proximal/core weakness, postural and gait impairment, pain in lumbar spine  and R knee, and reduced functional activity tolerance. THe patient strongly states that he feels like he is improving but does feel that he still has progress that needs to be made. Patient will benefit from continued skilled PT services in order to address these deficits and assist him in reaching an optimal level of function.    Pt will benefit from skilled therapeutic intervention in order to improve on the following deficits Abnormal gait;Hypomobility;Decreased activity tolerance;Decreased strength;Pain;Decreased balance;Decreased mobility;Difficulty walking;Decreased range of motion;Improper body mechanics;Decreased coordination;Impaired flexibility;Postural dysfunction   Rehab Potential Good   PT Frequency 2x / week   PT Duration 4 weeks   PT Treatment/Interventions ADLs/Self Care Home Management;Gait training;Stair training;Functional mobility training;Therapeutic activities;Therapeutic exercise;Balance training;Neuromuscular re-education;Patient/family education;Orthotic Fit/Training;Taping   PT Next Visit Plan continue to focus on strengthening of core and LE musculature.  Gt train using heel toe gt pattern.    PT Home Exercise Plan DC of previous program and given updates.    Consulted and Agree with Plan of Care Patient          G-Codes - 11/03/14 1552    Functional Assessment Tool Used Based on skilled clinical assessment of strength, ROM, pain, posture, gait, functional activity tolerance    Functional Limitation Mobility: Walking and moving around   Mobility: Walking and Moving Around Current Status [phone]([id number]) At least 20 percent but less than 40 percent impaired, limited or restricted   Mobility: Walking and Moving Around Goal Status [phone](G8979) At least 1 percent but less than 20 percent impaired, limited or restricted      Problem List Patient Active Problem List   Diagnosis Date Noted  . Vertigo 07/10/2014  . HTN (hypertension) 07/10/2014  . Peripheral vertigo 07/10/2014  .  Neurogenic pain, leg 01/19/2014  . Normocytic anemia 06/24/2013  . GANGLION CYST 05/24/2010   Physical Therapy Progress Note  Dates of Reporting Period: 09/25/14 to 11/03/14  Objective Reports of Subjective Statement: see above   Objective Measurements: see above   Goal Update: see above   Plan: see above   Reason Skilled Services are Required: lumbar and bilateral hip stiffness, R leg and lumbar pain, postural and gait impairment, reduced functional activity tolerance, bilateral leg/proximal weakness   Nedra HaiKristen Unger PT, DPT [phone]  [facility] [address] , [place], [address] Phone: [phone]   Fax:  [phone]

## 2014-11-05 ENCOUNTER — Ambulatory Visit (HOSPITAL_COMMUNITY): Payer: Commercial Managed Care - HMO

## 2014-11-05 DIAGNOSIS — R2689 Other abnormalities of gait and mobility: Secondary | ICD-10-CM

## 2014-11-05 DIAGNOSIS — M25651 Stiffness of right hip, not elsewhere classified: Secondary | ICD-10-CM | POA: Diagnosis not present

## 2014-11-05 DIAGNOSIS — R29898 Other symptoms and signs involving the musculoskeletal system: Secondary | ICD-10-CM

## 2014-11-05 DIAGNOSIS — M25652 Stiffness of left hip, not elsewhere classified: Secondary | ICD-10-CM | POA: Diagnosis not present

## 2014-11-05 DIAGNOSIS — M6281 Muscle weakness (generalized): Secondary | ICD-10-CM

## 2014-11-05 DIAGNOSIS — R293 Abnormal posture: Secondary | ICD-10-CM | POA: Diagnosis not present

## 2014-11-05 DIAGNOSIS — M545 Low back pain: Secondary | ICD-10-CM | POA: Diagnosis not present

## 2014-11-05 DIAGNOSIS — M25561 Pain in right knee: Secondary | ICD-10-CM | POA: Diagnosis not present

## 2014-11-05 DIAGNOSIS — R269 Unspecified abnormalities of gait and mobility: Secondary | ICD-10-CM

## 2014-11-05 DIAGNOSIS — R2681 Unsteadiness on feet: Secondary | ICD-10-CM

## 2014-11-05 DIAGNOSIS — M6289 Other specified disorders of muscle: Secondary | ICD-10-CM | POA: Diagnosis not present

## 2014-11-05 NOTE — Therapy (Signed)
[place] [facility] [address] [place], [place], [address] Phone: [phone]   Fax:  [phone]  Physical Therapy Treatment  Patient Details  Name: Jeremiah Murphy MRN: [id number] Date of Birth: 01-Jun-1940 Referring Provider:  Kathryne Hitch*  Encounter Date: 11/05/2014      PT End of Session - 11/05/14 1515    Visit Number 7   Number of Visits 16   Date for PT Re-Evaluation 12/01/14   Authorization Type [employer] Advantage; G-codes done 6th session    Authorization Time Period 09/25/14 to 11/25/14   Authorization - Visit Number 7   Authorization - Number of Visits 16   PT Start Time 1430   PT Stop Time 1516   PT Time Calculation (min) 46 min   Activity Tolerance Patient tolerated treatment well   Behavior During Therapy [facility] for tasks assessed/performed      Past Medical History  Diagnosis Date  . HTN (hypertension)   . Dyslipidemia   . Normocytic anemia     Past Surgical History  Procedure Laterality Date  . Carpal tunnel release  1970    left    There were no vitals filed for this visit.  Visit Diagnosis:  Bilateral low back pain, with sciatica presence unspecified  Abnormality of gait  Proximal muscle weakness  Hip stiffness, right  Right knee pain  Leg weakness, bilateral  Poor posture  Unsteadiness  Hip stiffness, left  Poor balance      Subjective Assessment - 11/05/14 1436    Subjective Pt stated lower back pain scale 4-5/10   Currently in Pain? Yes   Pain Score 5    Pain Location Back   Pain Orientation Right;Left;Lower;Mid             [facility] Adult PT Treatment/Exercise - 11/05/14 0001    Exercises   Exercises Lumbar   Lumbar Exercises: Stretches   Active Hamstring Stretch 3 reps;30 seconds   Active Hamstring Stretch Limitations supine with rope   Single Knee to Chest Stretch 3 reps;30 seconds   Lower Trunk Rotation 5 reps;10 seconds   Prone on Elbows Stretch 1  rep   Prone on Elbows Stretch Limitations 2 minutes   Piriformis Stretch 3 reps;30 seconds   Piriformis Stretch Limitations supine with towel   Lumbar Exercises: Standing   Heel Raises 10 reps   Heel Raises Limitations Toe raises   Other Standing Lumbar Exercises 3-D hip excursion    Lumbar Exercises: Supine   Ab Set 10 reps   Bent Knee Raise 10 reps;5 seconds   Bent Knee Raise Limitations cueing for stabilty   Bridge 10 reps;3 seconds   Straight Leg Raise 10 reps   Knee/Hip Exercises: Standing   Gait Training Gait training with cueing for heel to toe pattern and appropriate UE swing with opposite LE 520 feet                  PT Short Term Goals - 11/05/14 1818    PT SHORT TERM GOAL #1   Title Patient will demonstrate an improvement of at least 10 degrees in all lumbar plans of motion and bilateral hip IR, as well as improvement of lumbar rotation by at least 25%    Status On-going   PT SHORT TERM GOAL #2   Title Patient will demonstrate bilateral lower extremity strength of at least 4+/5 and proximal muscle/core strength of at least 4/5    Status On-going  PT SHORT TERM GOAL #3   Title Patient will experience no more than 2/10 pain at worst when it does occur in his R knee and low back    Status On-going   PT SHORT TERM GOAL #4   Title Patient will be able to verbalize the importance of maintaining good siting and standing posture, and will be able to maintain good posture at least 80% of the time during all functional tasks and activities    Status On-going           PT Long Term Goals - 11/05/14 1818    PT LONG TERM GOAL #1   Title Patient will be independent in correctly and consistently performing appropriate HEP, to be advanced PRN    Status Achieved   PT LONG TERM GOAL #2   Title Patient will demonstrate correct form during all lifting tasks in order to reduce chances of recurrence of or exacerbation of pain    PT LONG TERM GOAL #3   Title Patient will  demonstrate lumbar spine and bilateral hip ROM WNL for all planes measured   Status On-going   PT LONG TERM GOAL #4   Title Patient will experience 0/10 pain in his low back and R knee  during all functional tasks and activities                Plan - 11/05/14 1515    Clinical Impression Statement Began gait training with education on proper mechanics with cueing for heel to toe pattern and appropriate arm swing with opposite LE.  Pt demonstrated very stiff gait mechanics, added excursion exercises and stretches to improve LE and lumbar flexibilty to improve mobiltiy with gait.  Continued core strengthening exercises with cueing for stability.  Pt stated pain reduced at end of session with improved gait mechanics.     PT Next Visit Plan continue to focus on strengthening of core and LE musculature.  F/U with Gt train using heel toe gt pattern.         Problem List Patient Active Problem List   Diagnosis Date Noted  . Vertigo 07/10/2014  . HTN (hypertension) 07/10/2014  . Peripheral vertigo 07/10/2014  . Neurogenic pain, leg 01/19/2014  . Normocytic anemia 06/24/2013  . GANGLION CYST 05/24/2010   Becky Saxasey Samyah Bilbo, LPTA; CBIS [phone]  Juel BurrowCockerham, Zoii Florer Jo 11/05/2014, 6:19 PM  [place] [facility] [address] [place], [place], [address] Phone: [phone]   Fax:  [phone]

## 2014-11-05 NOTE — Patient Instructions (Signed)
Hip Extension   Lie on back, legs in air, knees bent. Grasp hands behind one thigh and cross other leg over same thigh. Hold 30  seconds. Repeat with other leg held. Repeat 3 times. Do 1-2 sessions per day.  Copyright  VHI. All rights reserved.   

## 2014-11-10 ENCOUNTER — Ambulatory Visit (HOSPITAL_COMMUNITY): Payer: Commercial Managed Care - HMO

## 2014-11-10 DIAGNOSIS — M25651 Stiffness of right hip, not elsewhere classified: Secondary | ICD-10-CM | POA: Diagnosis not present

## 2014-11-10 DIAGNOSIS — R269 Unspecified abnormalities of gait and mobility: Secondary | ICD-10-CM

## 2014-11-10 DIAGNOSIS — R293 Abnormal posture: Secondary | ICD-10-CM

## 2014-11-10 DIAGNOSIS — R2681 Unsteadiness on feet: Secondary | ICD-10-CM | POA: Diagnosis not present

## 2014-11-10 DIAGNOSIS — M6281 Muscle weakness (generalized): Secondary | ICD-10-CM

## 2014-11-10 DIAGNOSIS — M25652 Stiffness of left hip, not elsewhere classified: Secondary | ICD-10-CM | POA: Diagnosis not present

## 2014-11-10 DIAGNOSIS — R29898 Other symptoms and signs involving the musculoskeletal system: Secondary | ICD-10-CM

## 2014-11-10 DIAGNOSIS — R2689 Other abnormalities of gait and mobility: Secondary | ICD-10-CM

## 2014-11-10 DIAGNOSIS — M545 Low back pain: Secondary | ICD-10-CM

## 2014-11-10 DIAGNOSIS — M25561 Pain in right knee: Secondary | ICD-10-CM

## 2014-11-10 DIAGNOSIS — M6289 Other specified disorders of muscle: Secondary | ICD-10-CM | POA: Diagnosis not present

## 2014-11-10 NOTE — Therapy (Signed)
[facility] [address] [place], [place], [address] Phone: [phone]   Fax:  [phone]  Physical Therapy Treatment  Patient Details  Name: Jeremiah Murphy MRN: [id number] Date of Birth: 1941-02-13 Referring Provider:  Kathryne HitchBlackman, Christopher Y*  Encounter Date: 11/10/2014      PT End of Session - 11/10/14 1315    Visit Number 8   Number of Visits 16   Date for PT Re-Evaluation 12/01/14   Authorization Type [employer] Advantage; G-codes done 6th session    Authorization Time Period 09/25/14 to 11/25/14   Authorization - Visit Number 8   Authorization - Number of Visits 16   PT Start Time 1306   PT Stop Time 1348   PT Time Calculation (min) 42 min   Activity Tolerance Patient tolerated treatment well   Behavior During Therapy [facility] for tasks assessed/performed      Past Medical History  Diagnosis Date  . HTN (hypertension)   . Dyslipidemia   . Normocytic anemia     Past Surgical History  Procedure Laterality Date  . Carpal tunnel release  1970    left    There were no vitals filed for this visit.  Visit Diagnosis:  Bilateral low back pain, with sciatica presence unspecified  Abnormality of gait  Proximal muscle weakness  Hip stiffness, right  Right knee pain  Leg weakness, bilateral  Poor posture  Unsteadiness  Hip stiffness, left  Poor balance      Subjective Assessment - 11/10/14 1312    Subjective Pt stated back is feeling good today, a little sore in center of spine   Currently in Pain? No/denies            [facility] - 11/10/14 0001    Assessment   Medical Diagnosis LBP and R knee pain    Next MD Visit Magnus IvanBlackman 11/25/2014            [facility]/Ex[facility] - 11/10/14 0001    Exercises   Exercises Lumbar   Lumbar Exercises: Stretches   Active Hamstring Stretch 3 reps;30 seconds   Active Hamstring Stretch Limitations 14in step   Lower Trunk Rotation  Limitations 10 reps, 3 seconds   Hip Flexor Stretch 2 reps;30 seconds   Hip Flexor Stretch Limitations 14in step   Prone on Elbows Stretch 1 rep   Prone on Elbows Stretch Limitations 2 minutes   Piriformis Stretch 3 reps;30 seconds   Piriformis Stretch Limitations seated   Lumbar Exercises: Standing   Forward Lunge 10 reps   Forward Lunge Limitations 6in step   Other Standing Lumbar Exercises 3-D hip excursion    Lumbar Exercises: Supine   AB Set Limitations with bent knee raise   Bent Knee Raise 15 reps;5 seconds   Bridge 15 reps;4 seconds   Lumbar Exercises: Sidelying   Hip Abduction 10 reps   Hip Abduction Weights (lbs) therapist facilitation   Lumbar Exercises: Prone   Other Prone Lumbar Exercises heel squeeze 5x 5"                  PT Short Term Goals - 11/10/14 1315    PT SHORT TERM GOAL #1   Title Patient will demonstrate an improvement of at least 10 degrees in all lumbar plans of motion and bilateral hip IR, as well as improvement of lumbar rotation by at least 25%    Status On-going   PT SHORT TERM GOAL #2  Title Patient will demonstrate bilateral lower extremity strength of at least 4+/5 and proximal muscle/core strength of at least 4/5    Status On-going   PT SHORT TERM GOAL #3   Title Patient will experience no more than 2/10 pain at worst when it does occur in his R knee and low back    Status On-going   PT SHORT TERM GOAL #4   Title Patient will be able to verbalize the importance of maintaining good siting and standing posture, and will be able to maintain good posture at least 80% of the time during all functional tasks and activities    Status On-going           PT Long Term Goals - 11/10/14 1315    PT LONG TERM GOAL #1   Title Patient will be independent in correctly and consistently performing appropriate HEP, to be advanced PRN    Status Achieved   PT LONG TERM GOAL #2   Title Patient will demonstrate correct form during all lifting tasks  in order to reduce chances of recurrence of or exacerbation of pain    Status On-going   PT LONG TERM GOAL #3   Title Patient will demonstrate lumbar spine and bilateral hip ROM WNL for all planes measured   PT LONG TERM GOAL #4   Title Patient will experience 0/10 pain in his low back and R knee  during all functional tasks and activities                Plan - 11/10/14 1323    Clinical Impression Statement Pt improving gait mechanics, min cueing to increase stride length and heel to toe pattern and appropraite arm swing.  Began forward lunges and prone heel squeezes for gluteal strengthening.  Pt did c/o cramping Rt hamstrings during new exercises.  Pt does continue to demonstrate weak core and gluteal musculature though does report pain decreased and wife reports improved gait mechanics since began therapy.  No reports of pain through session.   PT Next Visit Plan continue to focus on strengthening of core and LE musculature.  F/U with Gt train using heel toe gt pattern.         Problem List Patient Active Problem List   Diagnosis Date Noted  . Vertigo 07/10/2014  . HTN (hypertension) 07/10/2014  . Peripheral vertigo 07/10/2014  . Neurogenic pain, leg 01/19/2014  . Normocytic anemia 06/24/2013  . GANGLION CYST 05/24/2010   Becky Sax, LPTA; CBIS [phone]  Juel Burrow 11/10/2014, 1:47 PM  [place] [facility] [address] [place], [place], [address] Phone: [phone]   Fax:  [phone]

## 2014-11-11 DIAGNOSIS — D649 Anemia, unspecified: Secondary | ICD-10-CM | POA: Diagnosis not present

## 2014-11-12 ENCOUNTER — Ambulatory Visit (HOSPITAL_COMMUNITY): Payer: Commercial Managed Care - HMO | Admitting: Physical Therapy

## 2014-11-12 ENCOUNTER — Telehealth (HOSPITAL_COMMUNITY): Payer: Self-pay | Admitting: Physical Therapy

## 2014-11-12 NOTE — Telephone Encounter (Signed)
Patient a no-show for today's appointment. Called and left message detailing time and date of next appointment.   Mirl Hillery PT, DPT [phone]  

## 2014-11-17 ENCOUNTER — Ambulatory Visit (HOSPITAL_COMMUNITY): Payer: Commercial Managed Care - HMO | Admitting: Physical Therapy

## 2014-11-17 DIAGNOSIS — R2681 Unsteadiness on feet: Secondary | ICD-10-CM | POA: Diagnosis not present

## 2014-11-17 DIAGNOSIS — M25561 Pain in right knee: Secondary | ICD-10-CM | POA: Diagnosis not present

## 2014-11-17 DIAGNOSIS — M545 Low back pain: Secondary | ICD-10-CM

## 2014-11-17 DIAGNOSIS — M25651 Stiffness of right hip, not elsewhere classified: Secondary | ICD-10-CM

## 2014-11-17 DIAGNOSIS — R293 Abnormal posture: Secondary | ICD-10-CM

## 2014-11-17 DIAGNOSIS — M25652 Stiffness of left hip, not elsewhere classified: Secondary | ICD-10-CM | POA: Diagnosis not present

## 2014-11-17 DIAGNOSIS — R269 Unspecified abnormalities of gait and mobility: Secondary | ICD-10-CM

## 2014-11-17 DIAGNOSIS — M6289 Other specified disorders of muscle: Secondary | ICD-10-CM | POA: Diagnosis not present

## 2014-11-17 DIAGNOSIS — R29898 Other symptoms and signs involving the musculoskeletal system: Secondary | ICD-10-CM | POA: Diagnosis not present

## 2014-11-17 DIAGNOSIS — M6281 Muscle weakness (generalized): Secondary | ICD-10-CM

## 2014-11-17 NOTE — Therapy (Signed)
[facility] [address] [place], [place], [address] Phone: [phone]   Fax:  [phone]  Physical Therapy Treatment  Patient Details  Name: Jeremiah Murphy MRN: [id number] Date of Birth: 01/30/1941 Referring Provider:  Kathryne Hitch*  Encounter Date: 11/17/2014      PT End of Session - 11/17/14 1337    Visit Number 9   Number of Visits 16   Date for PT Re-Evaluation 12/01/14   Authorization Time Period 09/25/14 to 11/25/14   Authorization - Visit Number 9   Authorization - Number of Visits 16   PT Start Time 1304   PT Stop Time 1348   PT Time Calculation (min) 44 min   Activity Tolerance Patient tolerated treatment well   Behavior During Therapy [facility] for tasks assessed/performed      Past Medical History  Diagnosis Date  . HTN (hypertension)   . Dyslipidemia   . Normocytic anemia     Past Surgical History  Procedure Laterality Date  . Carpal tunnel release  1970    left    There were no vitals filed for this visit.  Visit Diagnosis:  Bilateral low back pain, with sciatica presence unspecified  Abnormality of gait  Proximal muscle weakness  Hip stiffness, right  Right knee pain  Leg weakness, bilateral  Poor posture  Unsteadiness  Hip stiffness, left      Subjective Assessment - 11/17/14 1305    Subjective Patient reports that he is feeling OK today, just having a little bit of pain in his knee    Pertinent History Patient states that the pain in his back and knee in the fall of 2015; patient retired in [place] then went back to work in [employer] and came home with whole R side bruised. Never went to MD to get checked out; just last year started to complain of more pain in back and knee  as well as edema in R foot. Having pain on inner side of knee.    Currently in Pain? Yes   Pain Score 2    Pain Location Knee   Pain Orientation Right                         [facility] Adult  PT Treatment/Exercise - 11/17/14 0001    Lumbar Exercises: Supine   Bridge 15 reps   Straight Leg Raise 15 reps   Lumbar Exercises: Sidelying   Hip Abduction 10 reps   Hip Abduction Weights (lbs) therapist facilitation   Knee/Hip Exercises: Stretches   Active Hamstring Stretch Both;3 reps;30 seconds   Active Hamstring Stretch Limitations 8 inch box    Piriformis Stretch Both;2 reps;30 seconds   Piriformis Stretch Limitations seated   Gastroc Stretch --   Knee/Hip Exercises: Aerobic   Stationary Bike Nustep hills 4 level 2 10 minutes    Knee/Hip Exercises: Standing   Heel Raises Both;1 set;15 reps   Forward Lunges Both;1 set;10 reps   Forward Lunges Limitations 4 inch box    Rocker Board Limitations x20AP, x20 lateral no HHA    Other Standing Knee Exercises Hip ABD walks 2x28ft; attempted forward push kicks but unable to tolerate SLS based activity due to pain    Other Standing Knee Exercises 3D hip excursions 1x15; 2D walking hip excursions 2x57ft                 PT Education - 11/17/14 1337    Education provided  No          PT Short Term Goals - 11/10/14 1315    PT SHORT TERM GOAL #1   Title Patient will demonstrate an improvement of at least 10 degrees in all lumbar plans of motion and bilateral hip IR, as well as improvement of lumbar rotation by at least 25%    Status On-going   PT SHORT TERM GOAL #2   Title Patient will demonstrate bilateral lower extremity strength of at least 4+/5 and proximal muscle/core strength of at least 4/5    Status On-going   PT SHORT TERM GOAL #3   Title Patient will experience no more than 2/10 pain at worst when it does occur in his R knee and low back    Status On-going   PT SHORT TERM GOAL #4   Title Patient will be able to verbalize the importance of maintaining good siting and standing posture, and will be able to maintain good posture at least 80% of the time during all functional tasks and activities    Status On-going            PT Long Term Goals - 11/10/14 1315    PT LONG TERM GOAL #1   Title Patient will be independent in correctly and consistently performing appropriate HEP, to be advanced PRN    Status Achieved   PT LONG TERM GOAL #2   Title Patient will demonstrate correct form during all lifting tasks in order to reduce chances of recurrence of or exacerbation of pain    Status On-going   PT LONG TERM GOAL #3   Title Patient will demonstrate lumbar spine and bilateral hip ROM WNL for all planes measured   PT LONG TERM GOAL #4   Title Patient will experience 0/10 pain in his low back and R knee  during all functional tasks and activities                Plan - 11/17/14 1337    Clinical Impression Statement Focused on functional lower extremity and proximal muscle strength today. Attempted a single leg stance based exercise however patient unable to complete due to soreness in knee with SLS. Patient did require cues to perform new exercises correctly today.    Pt will benefit from skilled therapeutic intervention in order to improve on the following deficits Abnormal gait;Hypomobility;Decreased activity tolerance;Decreased strength;Pain;Decreased balance;Decreased mobility;Difficulty walking;Decreased range of motion;Improper body mechanics;Decreased coordination;Impaired flexibility;Postural dysfunction   Rehab Potential Good   PT Frequency 2x / week   PT Duration 4 weeks   PT Treatment/Interventions ADLs/Self Care Home Management;Gait training;Stair training;Functional mobility training;Therapeutic activities;Therapeutic exercise;Balance training;Neuromuscular re-education;Patient/family education;Orthotic Fit/Training;Taping   PT Next Visit Plan continue to focus on strengthening of core and LE musculature.  F/U with Gt train using heel toe gt pattern.    PT Home Exercise Plan DC of previous program and given updates.    Consulted and Agree with Plan of Care Patient        Problem  List Patient Active Problem List   Diagnosis Date Noted  . Vertigo 07/10/2014  . HTN (hypertension) 07/10/2014  . Peripheral vertigo 07/10/2014  . Neurogenic pain, leg 01/19/2014  . Normocytic anemia 06/24/2013  . GANGLION CYST 05/24/2010    Nedra Hai PT, DPT [phone]  [facility] [address] [place], [place], [address] Phone: [phone]   Fax:  [phone]

## 2014-11-19 ENCOUNTER — Ambulatory Visit (HOSPITAL_COMMUNITY): Payer: Commercial Managed Care - HMO | Admitting: Physical Therapy

## 2014-11-19 DIAGNOSIS — M25651 Stiffness of right hip, not elsewhere classified: Secondary | ICD-10-CM

## 2014-11-19 DIAGNOSIS — M6289 Other specified disorders of muscle: Secondary | ICD-10-CM | POA: Diagnosis not present

## 2014-11-19 DIAGNOSIS — R293 Abnormal posture: Secondary | ICD-10-CM

## 2014-11-19 DIAGNOSIS — M6281 Muscle weakness (generalized): Secondary | ICD-10-CM

## 2014-11-19 DIAGNOSIS — M25561 Pain in right knee: Secondary | ICD-10-CM

## 2014-11-19 DIAGNOSIS — M545 Low back pain: Secondary | ICD-10-CM | POA: Diagnosis not present

## 2014-11-19 DIAGNOSIS — R29898 Other symptoms and signs involving the musculoskeletal system: Secondary | ICD-10-CM

## 2014-11-19 DIAGNOSIS — M25652 Stiffness of left hip, not elsewhere classified: Secondary | ICD-10-CM | POA: Diagnosis not present

## 2014-11-19 DIAGNOSIS — R2681 Unsteadiness on feet: Secondary | ICD-10-CM

## 2014-11-19 DIAGNOSIS — R269 Unspecified abnormalities of gait and mobility: Secondary | ICD-10-CM

## 2014-11-19 NOTE — Therapy (Signed)
[facility] [address] [place], [place], [address] Phone: [phone]   Fax:  [phone]  Physical Therapy Treatment  Patient Details  Name: Jeremiah Murphy MRN: [id number] Date of Birth: 24-Jan-1941 Referring Provider:  Kathryne Hitch*  Encounter Date: 11/19/2014      PT End of Session - 11/19/14 1425    Visit Number 10   Number of Visits 16   Date for PT Re-Evaluation 12/01/14   Authorization Type [employer]age; G-codes done 6th session    Authorization Time Period 09/25/14 to 11/25/14   Authorization - Visit Number 10   Authorization - Number of Visits 16   Activity Tolerance Patient tolerated treatment well   Behavior During Therapy [facility] for tasks assessed/performed      Past Medical History  Diagnosis Date  . HTN (hypertension)   . Dyslipidemia   . Normocytic anemia     Past Surgical History  Procedure Laterality Date  . Carpal tunnel release  1970    left    There were no vitals filed for this visit.  Visit Diagnosis:  Bilateral low back pain, with sciatica presence unspecified  Abnormality of gait  Proximal muscle weakness  Hip stiffness, right  Right knee pain  Leg weakness, bilateral  Poor posture  Unsteadiness  Hip stiffness, left      Subjective Assessment - 11/19/14 1305    Subjective Patient reports that he is having a little bit of pain in his back, feeling good today otherwise; reports that he is willing to continue with skilled PT services    Pertinent History Patient states that the pain in his back and knee in the fall of 2015; patient retired in [place] then went back to work in [employer] and came home with whole R side bruised. Never went to MD to get checked out; just last year started to complain of more pain in back and knee  as well as edema in R foot. Having pain on inner side of knee.    Currently in Pain? Yes  attempted to get patient to rate on pain  scale however he did not provide a number, kept stating "its good today"                          [facility] Adult PT Treatment/Exercise - 11/19/14 0001    Ambulation/Gait   Gait Comments unsteady, needed to in   Lumbar Exercises: Stretches   Hip Flexor Stretch 2 reps;30 seconds   Hip Flexor Stretch Limitations 12 inch step    Lumbar Exercises: Standing   Heel Raises 15 reps   Heel Raises Limitations heel raises    Forward Lunge 10 reps   Forward Lunge Limitations 6 inch step    Side Lunge 10 reps;Limitations   Side Lunge Limitations 6 inch box    Other Standing Lumbar Exercises 3D hip excursions 1x15   Lumbar Exercises: Seated   Other Seated Lumbar Exercises seated alternating opposite UE/LE flexion 1x10 with cues for core activation    Lumbar Exercises: Supine   Bridge 10 reps   Bridge Limitations x2; one with L leg back, one with R leg back    Straight Leg Raise 15 reps   Lumbar Exercises: Sidelying   Hip Abduction 15 reps   Hip Abduction Weights (lbs) therapist facilitations for form    Knee/Hip Exercises: Stretches   Active Hamstring Stretch Both;3 reps;30 seconds   Active Hamstring  Stretch Limitations 12 inch box    Piriformis Stretch Both;2 reps;30 seconds   Gastroc Stretch Both;3 reps;30 seconds   Manual Therapy   Manual Therapy Soft tissue mobilization   Soft tissue mobilization soft tissue mobilization of R ITB                 PT Education - 11/19/14 1425    Education provided Yes   Education Details effect of ITB on knee pain    Person(s) Educated Patient   Methods Explanation   Comprehension Verbalized understanding          PT Short Term Goals - 11/10/14 1315    PT SHORT TERM GOAL #1   Title Patient will demonstrate an improvement of at least 10 degrees in all lumbar plans of motion and bilateral hip IR, as well as improvement of lumbar rotation by at least 25%    Status On-going   PT SHORT TERM GOAL #2   Title Patient will  demonstrate bilateral lower extremity strength of at least 4+/5 and proximal muscle/core strength of at least 4/5    Status On-going   PT SHORT TERM GOAL #3   Title Patient will experience no more than 2/10 pain at worst when it does occur in his R knee and low back    Status On-going   PT SHORT TERM GOAL #4   Title Patient will be able to verbalize the importance of maintaining good siting and standing posture, and will be able to maintain good posture at least 80% of the time during all functional tasks and activities    Status On-going           PT Long Term Goals - 11/10/14 1315    PT LONG TERM GOAL #1   Title Patient will be independent in correctly and consistently performing appropriate HEP, to be advanced PRN    Status Achieved   PT LONG TERM GOAL #2   Title Patient will demonstrate correct form during all lifting tasks in order to reduce chances of recurrence of or exacerbation of pain    Status On-going   PT LONG TERM GOAL #3   Title Patient will demonstrate lumbar spine and bilateral hip ROM WNL for all planes measured   PT LONG TERM GOAL #4   Title Patient will experience 0/10 pain in his low back and R knee  during all functional tasks and activities                Plan - 11/19/14 1353    Clinical Impression Statement Continued focus on functional strength with gait training today as well as introduction of soft tissue mobilzation of R distal ITB. Patient somewhat unsteady during gait today, however did respond well to ITB mobilization with reduced pain and tension in the local area. Requested that patient schedule 8 more sessions.    Pt will benefit from skilled therapeutic intervention in order to improve on the following deficits Abnormal gait;Hypomobility;Decreased activity tolerance;Decreased strength;Pain;Decreased balance;Decreased mobility;Difficulty walking;Decreased range of motion;Improper body mechanics;Decreased coordination;Impaired flexibility;Postural  dysfunction   Rehab Potential Good   PT Frequency 2x / week   PT Duration 4 weeks   PT Treatment/Interventions ADLs/Self Care Home Management;Gait training;Stair training;Functional mobility training;Therapeutic activities;Therapeutic exercise;Balance training;Neuromuscular re-education;Patient/family education;Orthotic Fit/Training;Taping   PT Next Visit Plan continue to focus on strengthening of core and LE musculature.  F/U with Gt train using heel toe gt pattern.  Continue ITB work.    PT Home Exercise Plan DC of  previous program and given updates.    Consulted and Agree with Plan of Care Patient        Problem List Patient Active Problem List   Diagnosis Date Noted  . Vertigo 07/10/2014  . HTN (hypertension) 07/10/2014  . Peripheral vertigo 07/10/2014  . Neurogenic pain, leg 01/19/2014  . Normocytic anemia 06/24/2013  . GANGLION CYST 05/24/2010    Nedra Hai PT, DPT [phone]  [facility] [address] [place], [place], [address] Phone: [phone]   Fax:  [phone]

## 2014-11-23 ENCOUNTER — Encounter: Payer: Self-pay | Admitting: Internal Medicine

## 2014-11-24 ENCOUNTER — Ambulatory Visit (HOSPITAL_COMMUNITY): Payer: Commercial Managed Care - HMO | Attending: Orthopaedic Surgery

## 2014-11-24 DIAGNOSIS — M25561 Pain in right knee: Secondary | ICD-10-CM | POA: Insufficient documentation

## 2014-11-24 DIAGNOSIS — R2689 Other abnormalities of gait and mobility: Secondary | ICD-10-CM | POA: Diagnosis present

## 2014-11-24 DIAGNOSIS — R269 Unspecified abnormalities of gait and mobility: Secondary | ICD-10-CM | POA: Diagnosis not present

## 2014-11-24 DIAGNOSIS — R2681 Unsteadiness on feet: Secondary | ICD-10-CM | POA: Diagnosis not present

## 2014-11-24 DIAGNOSIS — M25651 Stiffness of right hip, not elsewhere classified: Secondary | ICD-10-CM

## 2014-11-24 DIAGNOSIS — M25652 Stiffness of left hip, not elsewhere classified: Secondary | ICD-10-CM | POA: Diagnosis not present

## 2014-11-24 DIAGNOSIS — R29898 Other symptoms and signs involving the musculoskeletal system: Secondary | ICD-10-CM | POA: Insufficient documentation

## 2014-11-24 DIAGNOSIS — M545 Low back pain: Secondary | ICD-10-CM | POA: Insufficient documentation

## 2014-11-24 DIAGNOSIS — R293 Abnormal posture: Secondary | ICD-10-CM | POA: Insufficient documentation

## 2014-11-24 DIAGNOSIS — M6289 Other specified disorders of muscle: Secondary | ICD-10-CM | POA: Diagnosis not present

## 2014-11-24 DIAGNOSIS — M6281 Muscle weakness (generalized): Secondary | ICD-10-CM

## 2014-11-24 NOTE — Therapy (Signed)
[facility] [address] [place], [place], [address] Phone: [phone]   Fax:  [phone]  Physical Therapy Treatment  Patient Details  Name: Jeremiah Murphy MRN: [id number] Date of Birth: 10/11/1940 Referring Provider:  Catalina Pizza, MD  Encounter Date: 11/24/2014      PT End of Session - 11/24/14 1524    Visit Number 11   Number of Visits 16   Date for PT Re-Evaluation 12/01/14   Authorization Type [employer] Advantage; G-codes done 6th session    Authorization Time Period 09/25/14 to 11/25/14   Authorization - Visit Number 11   Authorization - Number of Visits 16   PT Start Time 1522   PT Stop Time 1606   PT Time Calculation (min) 44 min   Activity Tolerance Patient tolerated treatment well   Behavior During Therapy [facility] for tasks assessed/performed      Past Medical History  Diagnosis Date  . HTN (hypertension)   . Dyslipidemia   . Normocytic anemia     Past Surgical History  Procedure Laterality Date  . Carpal tunnel release  1970    left    There were no vitals filed for this visit.  Visit Diagnosis:  Bilateral low back pain, with sciatica presence unspecified  Abnormality of gait  Proximal muscle weakness  Hip stiffness, right  Right knee pain  Leg weakness, bilateral  Poor posture  Unsteadiness  Hip stiffness, left  Poor balance      Subjective Assessment - 11/24/14 1523    Subjective Pt stated back is feeling good today, minimal pain Rt posterior knee,     Currently in Pain? Yes   Pain Score 2    Pain Location Knee   Pain Orientation Right;Posterior   Pain Descriptors / Indicators Aching            [facility] PT Assessment - 11/24/14 0001    Assessment   Medical Diagnosis LBP and R knee pain    Next MD Visit Magnus Ivan 11/25/2014   AROM   Right Hip External Rotation  --  WNL   Right Hip Internal Rotation  --  was 26   Left Hip External Rotation  --  WNL   Left Hip Internal  Rotation  --  was 35   Lumbar Flexion 66  was 63   Lumbar Extension 23  was 23   Lumbar - Right Side Bend limited 20%  was 28   Lumbar - Left Side Bend limited 20%  was 28   Lumbar - Right Rotation 25% limited approx   was 25% limited    Lumbar - Left Rotation 25% limited approx   was 25% limited   Strength   Right Hip Flexion 5/5   Right Hip Extension 2+/5  was 2+/5   Right Hip ABduction 3+/5  was 3+/5   Left Hip Flexion 5/5   Left Hip Extension 2+/5  was 3-/5   Left Hip ABduction 3+/5  was 3+/5   Right Knee Flexion 4/5  was 4/5   Right Knee Extension 5/5   Left Knee Flexion 4/5  was 4-/5   Left Knee Extension 5/5   Right Ankle Dorsiflexion 5/5   Left Ankle Dorsiflexion 5/5                     [facility] Adult PT Treatment/Exercise - 11/24/14 0001    Exercises   Exercises Lumbar;Knee/Hip   Lumbar Exercises: Prone   Other  Prone Lumbar Exercises heel squeeze 5x 5"   Knee/Hip Exercises: Stretches   Active Hamstring Stretch Both;3 reps;30 seconds   Active Hamstring Stretch Limitations 14in step   Piriformis Stretch Both;2 reps;30 seconds   Piriformis Stretch Limitations seated   Gastroc Stretch Both;3 reps;30 seconds   Gastroc Stretch Limitations slantboard    Knee/Hip Exercises: Standing   Forward Lunges Both;15 reps   Forward Lunges Limitations 4in step   Side Lunges Both;10 reps   Side Lunges Limitations 4in step   Wall Squat 5 sets;5 seconds   Other Standing Knee Exercises 3D hip excursion 15x each                  PT Short Term Goals - 11/24/14 1525    PT SHORT TERM GOAL #1   Title Patient will demonstrate an improvement of at least 10 degrees in all lumbar plans of motion and bilateral hip IR, as well as improvement of lumbar rotation by at least 25%    Status On-going   PT SHORT TERM GOAL #2   Title Patient will demonstrate bilateral lower extremity strength of at least 4+/5 and proximal muscle/core strength of at least 4/5     Status On-going   PT SHORT TERM GOAL #3   Title Patient will experience no more than 2/10 pain at worst when it does occur in his R knee and low back    Baseline 11/24/2014:  Reports no back pain in 2 weeks and knee pain 2/10 in morning time only.     Status Achieved   PT SHORT TERM GOAL #4   Title Patient will be able to verbalize the importance of maintaining good siting and standing posture, and will be able to maintain good posture at least 80% of the time during all functional tasks and activities    Status On-going           PT Long Term Goals - 11/24/14 1527    PT LONG TERM GOAL #1   Title Patient will be independent in correctly and consistently performing appropriate HEP, to be advanced PRN    Status Achieved   PT LONG TERM GOAL #2   Title Patient will demonstrate correct form during all lifting tasks in order to reduce chances of recurrence of or exacerbation of pain    Status On-going   PT LONG TERM GOAL #3   Title Patient will demonstrate lumbar spine and bilateral hip ROM WNL for all planes measured   PT LONG TERM GOAL #4   Title Patient will experience 0/10 pain in his low back and R knee  during all functional tasks and activities    Status On-going               Plan - 11/24/14 1534    Clinical Impression Statement Reviewed goals prior MD apt tomorrow.  Pt reports compliance with HEP daily and reports increased awareness of posture though does report his wife also tells him to improve posture while sitting and standing.  Pt stated back pain has not been an issue for the last couple weeks and knee has minimal pain.  Pt continues to have restricted  lumbar and hip mobilty and presents with weak gluteal musculature.  Added gluteal strengthenign exercises with reports of knee pain resolved at end of session.     PT Next Visit Plan F/U with MD following apt tomorrow (11/25/2014) Continue to focus on strengthening of core and LE musculature.  F/U with Gt train  using heel  toe gt pattern.  Continue ITB work.         Problem List Patient Active Problem List   Diagnosis Date Noted  . Vertigo 07/10/2014  . HTN (hypertension) 07/10/2014  . Peripheral vertigo 07/10/2014  . Neurogenic pain, leg 01/19/2014  . Normocytic anemia 06/24/2013  . GANGLION CYST 05/24/2010   Becky Sax, LPTA; CBIS [phone]  Juel Burrow 11/24/2014, 4:28 PM  [facility] [address] [place], [place], [address] Phone: [phone]   Fax:  [phone]

## 2014-11-24 NOTE — Therapy (Deleted)
[facility] [address] [place], [place], [address] Phone: [phone]   Fax:  [phone]  Wound Care Therapy  Patient Details  Name: Jeremiah Murphy MRN: [id number] Date of Birth: [birth year] Referring Provider:  Catalina Pizza, MD  Encounter Date: 11/24/2014      PT End of Session - 11/24/14 1524    Visit Number 11   Number of Visits 16   Date for PT Re-Evaluation 12/01/14   Authorization Type [employer]age; G-codes done 6th session    Authorization Time Period 09/25/14 to 11/25/14   Authorization - Visit Number 11   Authorization - Number of Visits 16   PT Start Time 1522   PT Stop Time 1606   PT Time Calculation (min) 44 min   Activity Tolerance Patient tolerated treatment well   Behavior During Therapy [facility] for tasks assessed/performed      Past Medical History  Diagnosis Date  . HTN (hypertension)   . Dyslipidemia   . Normocytic anemia     Past Surgical History  Procedure Laterality Date  . Carpal tunnel release  1970    left    There were no vitals filed for this visit.  Visit Diagnosis:  Bilateral low back pain, with sciatica presence unspecified  Abnormality of gait  Proximal muscle weakness  Hip stiffness, right  Right knee pain  Leg weakness, bilateral  Poor posture  Unsteadiness  Hip stiffness, left  Poor balance      Subjective Assessment - 11/24/14 1523    Subjective Pt stated back is feeling good today, minimal pain Rt posterior knee,     Currently in Pain? Yes   Pain Score 2    Pain Location Knee   Pain Orientation Right;Posterior   Pain Descriptors / Indicators Aching          [facility] PT Assessment - 11/24/14 0001    Assessment   Medical Diagnosis LBP and R knee pain    Next MD Visit Magnus Ivan 11/25/2014   AROM   Right Hip External Rotation  --  WNL   Right Hip Internal Rotation  --  was 26   Left Hip External Rotation  --  WNL   Left Hip Internal Rotation  --   was 35   Lumbar Flexion 66  was 63   Lumbar Extension 23  was 23   Lumbar - Right Side Bend limited 20%  was 28   Lumbar - Left Side Bend limited 20%  was 28   Lumbar - Right Rotation 25% limited approx   was 25% limited    Lumbar - Left Rotation 25% limited approx   was 25% limited   Strength   Right Hip Flexion 5/5   Right Hip Extension 2+/5  was 26/5   Right Hip ABduction 3+/5  was 3+/5   Left Hip Flexion 5/5   Left Hip Extension --  was 3-/5   Left Hip ABduction 3+/5  was 3+/5   Right Knee Flexion 4/5  was 4/5   Right Knee Extension 5/5   Left Knee Flexion 4/5  was 4-85   Left Knee Extension 5/5   Right Ankle Dorsiflexion 5/5   Left Ankle Dorsiflexion 5/5           [facility] Adult PT Treatment/Exercise - 11/24/14 0001    Exercises   Exercises Lumbar;Knee/Hip   Lumbar Exercises: Prone   Other Prone Lumbar Exercises heel squeeze 5x 5"   Knee/Hip Exercises: Stretches  Active Hamstring Stretch Both;3 reps;30 seconds   Active Hamstring Stretch Limitations 14in step   Piriformis Stretch Both;2 reps;30 seconds   Piriformis Stretch Limitations seated   Gastroc Stretch Both;3 reps;30 seconds   Gastroc Stretch Limitations slantboard    Knee/Hip Exercises: Standing   Forward Lunges Both;15 reps   Forward Lunges Limitations 4in step   Side Lunges Both;10 reps   Side Lunges Limitations 4in step   Wall Squat 5 sets;5 seconds   Other Standing Knee Exercises 3D hip excursion 15x each            PT Short Term Goals - 11/24/14 1525    PT SHORT TERM GOAL #1   Title Patient will demonstrate an improvement of at least 10 degrees in all lumbar plans of motion and bilateral hip IR, as well as improvement of lumbar rotation by at least 25%    Status On-going   PT SHORT TERM GOAL #2   Title Patient will demonstrate bilateral lower extremity strength of at least 4+/5 and proximal muscle/core strength of at least 4/5    Status On-going   PT SHORT TERM GOAL #3   Title  Patient will experience no more than 2/10 pain at worst when it does occur in his R knee and low back    Baseline 11/24/2014:  Reports no back pain in 2 weeks and knee pain 2/10 in morning time only.     Status Achieved   PT SHORT TERM GOAL #4   Title Patient will be able to verbalize the importance of maintaining good siting and standing posture, and will be able to maintain good posture at least 80% of the time during all functional tasks and activities    Status On-going           PT Long Term Goals - 11/24/14 1527    PT LONG TERM GOAL #1   Title Patient will be independent in correctly and consistently performing appropriate HEP, to be advanced PRN    Status Achieved   PT LONG TERM GOAL #2   Title Patient will demonstrate correct form during all lifting tasks in order to reduce chances of recurrence of or exacerbation of pain    Status On-going   PT LONG TERM GOAL #3   Title Patient will demonstrate lumbar spine and bilateral hip ROM WNL for all planes measured   PT LONG TERM GOAL #4   Title Patient will experience 0/10 pain in his low back and R knee  during all functional tasks and activities    Status On-going               Plan - 11/24/14 1534    Clinical Impression Statement Reviewed goals prior MD apt tomorrow.  Pt reports compliance with HEP daily and reports increased awareness of posture though does report his wife also tells him to improve posture while sitting and standing.  Pt stated back pain has not been an issue for the last couple weeks and knee has minimal pain.  Pt continues to have restricted  lumbar and hip mobilty and presents with weak gluteal musculature.  Added gluteal strengthenign exercises with reports of knee pain resolved at end of session.     PT Next Visit Plan F/U with MD following apt tomorrow (11/25/2014) Continue to focus on strengthening of core and LE musculature.  F/U with Gt train using heel toe gt pattern.  Continue ITB work.           Problem  List Patient Active Problem List   Diagnosis Date Noted  . Vertigo 07/10/2014  . HTN (hypertension) 07/10/2014  . Peripheral vertigo 07/10/2014  . Neurogenic pain, leg 01/19/2014  . Normocytic anemia 06/24/2013  . GANGLION CYST 05/24/2010   Becky Sax, LPTA; CBIS [phone]  Juel Burrow 11/24/2014, 4:14 PM  [facility] [address] [place], [place], [address] Phone: [phone]   Fax:  [phone]

## 2014-11-26 ENCOUNTER — Ambulatory Visit (HOSPITAL_COMMUNITY): Payer: Commercial Managed Care - HMO

## 2014-11-26 DIAGNOSIS — M6281 Muscle weakness (generalized): Secondary | ICD-10-CM

## 2014-11-26 DIAGNOSIS — R269 Unspecified abnormalities of gait and mobility: Secondary | ICD-10-CM

## 2014-11-26 DIAGNOSIS — M25651 Stiffness of right hip, not elsewhere classified: Secondary | ICD-10-CM

## 2014-11-26 DIAGNOSIS — R29898 Other symptoms and signs involving the musculoskeletal system: Secondary | ICD-10-CM

## 2014-11-26 DIAGNOSIS — M545 Low back pain: Secondary | ICD-10-CM | POA: Diagnosis not present

## 2014-11-26 DIAGNOSIS — R2681 Unsteadiness on feet: Secondary | ICD-10-CM

## 2014-11-26 DIAGNOSIS — R2689 Other abnormalities of gait and mobility: Secondary | ICD-10-CM

## 2014-11-26 DIAGNOSIS — R293 Abnormal posture: Secondary | ICD-10-CM | POA: Diagnosis not present

## 2014-11-26 DIAGNOSIS — M25652 Stiffness of left hip, not elsewhere classified: Secondary | ICD-10-CM

## 2014-11-26 DIAGNOSIS — M6289 Other specified disorders of muscle: Secondary | ICD-10-CM | POA: Diagnosis not present

## 2014-11-26 DIAGNOSIS — M25561 Pain in right knee: Secondary | ICD-10-CM

## 2014-11-26 NOTE — Therapy (Signed)
[facility] [address] [place], [place], [address] Phone: [phone]   Fax:  [phone]  Physical Therapy Treatment  Patient Details  Name: Jeremiah Murphy MRN: [id number] Date of Birth: [birth year] Referring Provider:  Kathryne Hitch*  Encounter Date: 11/26/2014      PT End of Session - 11/26/14 1531    Visit Number 12   Number of Visits 16   Date for PT Re-Evaluation 12/01/14   Authorization Type [employer]vantage; G-codes done 6th session    Authorization Time Period 09/25/14 to 11/25/14   Authorization - Visit Number 12   Authorization - Number of Visits 16   PT Start Time 1520   PT Stop Time 1558   PT Time Calculation (min) 38 min   Activity Tolerance Patient tolerated treatment well   Behavior During Therapy [facility] for tasks assessed/performed      Past Medical History  Diagnosis Date  . HTN (hypertension)   . Dyslipidemia   . Normocytic anemia     Past Surgical History  Procedure Laterality Date  . Carpal tunnel release  1970    left    There were no vitals filed for this visit.  Visit Diagnosis:  Bilateral low back pain, with sciatica presence unspecified  Abnormality of gait  Proximal muscle weakness  Hip stiffness, right  Right knee pain  Leg weakness, bilateral  Poor posture  Unsteadiness  Hip stiffness, left  Poor balance      Subjective Assessment - 11/26/14 1528    Subjective Pt stated back is feeling great today, minimal pain Rt knee pain scale 2/10.  Pt had to reschedule MD apt due to not wanting to drive in the rain.   Currently in Pain? Yes   Pain Score 2    Pain Location Knee   Pain Orientation Right;Posterior   Pain Descriptors / Indicators Aching           [facility] Adult PT Treatment/Exercise - 11/26/14 0001    Exercises   Exercises Lumbar;Knee/Hip   Lumbar Exercises: Stretches   Active Hamstring Stretch 3 reps;30 seconds   Active Hamstring Stretch  Limitations 14in step   Hip Flexor Stretch 2 reps;30 seconds   Hip Flexor Stretch Limitations 12 inch step    Prone on Elbows Stretch 1 rep   Prone on Elbows Stretch Limitations 2 minutes   Piriformis Stretch 3 reps;30 seconds   Piriformis Stretch Limitations seated   Lumbar Exercises: Prone   Straight Leg Raise 10 reps   Straight Leg Raises Limitations 2 sets x 10 reps BLE   Other Prone Lumbar Exercises heel squeeze 5x 5"   Knee/Hip Exercises: Stretches   Active Hamstring Stretch Both;3 reps;30 seconds   Active Hamstring Stretch Limitations 14in step   Piriformis Stretch Both;2 reps;30 seconds   Piriformis Stretch Limitations seated   Gastroc Stretch Both;3 reps;30 seconds   Gastroc Stretch Limitations slantboard    Knee/Hip Exercises: Standing   Wall Squat 10 reps;5 seconds   Other Standing Knee Exercises sidestepping with RTB 2RT   Other Standing Knee Exercises 3D hip excursion 15x each           PT Short Term Goals - 11/26/14 1534    PT SHORT TERM GOAL #1   Title Patient will demonstrate an improvement of at least 10 degrees in all lumbar plans of motion and bilateral hip IR, as well as improvement of lumbar rotation by at least 25%    Status  On-going   PT SHORT TERM GOAL #2   Title Patient will demonstrate bilateral lower extremity strength of at least 4+/5 and proximal muscle/core strength of at least 4/5    Status On-going   PT SHORT TERM GOAL #3   Title Patient will experience no more than 2/10 pain at worst when it does occur in his R knee and low back    Status Achieved   PT SHORT TERM GOAL #4   Title Patient will be able to verbalize the importance of maintaining good siting and standing posture, and will be able to maintain good posture at least 80% of the time during all functional tasks and activities    Status On-going           PT Long Term Goals - 11/26/14 1535    PT LONG TERM GOAL #1   Title Patient will be independent in correctly and consistently  performing appropriate HEP, to be advanced PRN    Status Achieved   PT LONG TERM GOAL #2   Title Patient will demonstrate correct form during all lifting tasks in order to reduce chances of recurrence of or exacerbation of pain    Status On-going   PT LONG TERM GOAL #3   Title Patient will demonstrate lumbar spine and bilateral hip ROM WNL for all planes measured   Status On-going   PT LONG TERM GOAL #4   Title Patient will experience 0/10 pain in his low back and R knee  during all functional tasks and activities                Plan - 11/26/14 1546    Clinical Impression Statement Session focus on improving functional strengthening with main focus on gluteal strenghtening.  Pt able to demonstrate appropraite form and techniques following cueing for form.  Continued mobility exercises and stretches to improve lumbar and hip mobilty.  No reports of increased pain through session, just fatigue.     PT Next Visit Plan Continue to focus on strengthening of core and LE musculature.  F/U with Gt train using heel toe gt pattern.  Continue ITB work.  Trial with quadruped position exercises for gluteal and core strengthening next session.          Problem List Patient Active Problem List   Diagnosis Date Noted  . Vertigo 07/10/2014  . HTN (hypertension) 07/10/2014  . Peripheral vertigo 07/10/2014  . Neurogenic pain, leg 01/19/2014  . Normocytic anemia 06/24/2013  . GANGLION CYST 05/24/2010   Becky Sax, LPTA; CBIS [phone]  Juel Burrow 11/26/2014, 4:01 PM  [place] [facility] [address] [place], [place], [address] Phone: [phone]   Fax:  [phone]

## 2014-11-27 NOTE — Addendum Note (Signed)
Addended by: Milinda Pointer on: 11/27/2014 05:06 PM   Modules accepted: Orders

## 2014-12-01 ENCOUNTER — Ambulatory Visit (HOSPITAL_COMMUNITY): Payer: Commercial Managed Care - HMO | Admitting: Physical Therapy

## 2014-12-03 ENCOUNTER — Encounter (HOSPITAL_COMMUNITY): Payer: Self-pay

## 2014-12-03 ENCOUNTER — Ambulatory Visit (HOSPITAL_COMMUNITY): Payer: Commercial Managed Care - HMO

## 2014-12-03 DIAGNOSIS — R2681 Unsteadiness on feet: Secondary | ICD-10-CM | POA: Diagnosis not present

## 2014-12-03 DIAGNOSIS — R29898 Other symptoms and signs involving the musculoskeletal system: Secondary | ICD-10-CM | POA: Diagnosis not present

## 2014-12-03 DIAGNOSIS — M25651 Stiffness of right hip, not elsewhere classified: Secondary | ICD-10-CM

## 2014-12-03 DIAGNOSIS — R269 Unspecified abnormalities of gait and mobility: Secondary | ICD-10-CM | POA: Diagnosis not present

## 2014-12-03 DIAGNOSIS — M25561 Pain in right knee: Secondary | ICD-10-CM

## 2014-12-03 DIAGNOSIS — R2689 Other abnormalities of gait and mobility: Secondary | ICD-10-CM

## 2014-12-03 DIAGNOSIS — M25652 Stiffness of left hip, not elsewhere classified: Secondary | ICD-10-CM

## 2014-12-03 DIAGNOSIS — M545 Low back pain: Secondary | ICD-10-CM

## 2014-12-03 DIAGNOSIS — R293 Abnormal posture: Secondary | ICD-10-CM

## 2014-12-03 DIAGNOSIS — M6281 Muscle weakness (generalized): Secondary | ICD-10-CM

## 2014-12-03 DIAGNOSIS — M6289 Other specified disorders of muscle: Secondary | ICD-10-CM | POA: Diagnosis not present

## 2014-12-03 NOTE — Therapy (Signed)
[place], [place], [address] Phone: [phone]   Fax:  [phone]  Physical Therapy Treatment  Patient Details  Name: Jeremiah Murphy MRN: [id number] Date of Birth: 09-20-1940 Referring Provider:  Mcarthur Rossetti*  Encounter Date: 12/03/2014      PT End of Session - 12/03/14 1446    Visit Number 13   Number of Visits 16   Date for PT Re-Evaluation 12/01/14   Authorization Type [employer]vantage; G-codes done 6th session    Authorization Time Period 09/25/14 to 11/25/14   Authorization - Visit Number 13   Authorization - Number of Visits 16   PT Start Time 1350   PT Stop Time 1430   PT Time Calculation (min) 40 min   Activity Tolerance Patient tolerated treatment well  Decrease in R knee symptoms, increase in back pain.    Behavior During Therapy [facility] for tasks assessed/performed      Past Medical History  Diagnosis Date  . HTN (hypertension)   . Dyslipidemia   . Normocytic anemia     Past Surgical History  Procedure Laterality Date  . Carpal tunnel release  1970    left    There were no vitals filed for this visit.  Visit Diagnosis:  Bilateral low back pain, with sciatica presence unspecified  Abnormality of gait  Poor balance  Proximal muscle weakness  Hip stiffness, right  Right knee pain  Leg weakness, bilateral  Poor posture  Unsteadiness  Hip stiffness, left      Subjective Assessment - 12/03/14 1356    Subjective Pt with acute R medial knee pain, and into calf. Reports it to be better today, but remnains tingly in R medial knee.    Pertinent History Patient states that the pain in his back and knee in the fall of 2015; patient retired in [place] then went back to work in [employer] and came home with whole R side bruised. Never went to MD to get checked out; just last year started to complain of more pain in back and knee  as well as edema in R foot.  Having pain on inner side of knee.    Patient Stated Goals get rid of pain in back and leg    Currently in Pain? Yes   Pain Score 3    Pain Location Knee   Pain Orientation Right;Medial   Pain Descriptors / Indicators Pins and needles;Tingling   Pain Type Chronic pain   Pain Radiating Towards calf and foot   Pain Relieving Factors Stretcing helps with back pain upon waking in morning.                          [place] Adult PT Treatment/Exercise - 12/03/14 0001    Ambulation/Gait   Gait Comments Wide base gait, R lateral lean during R stance  Rigid trunk c poor rotational mobility.    Lumbar Exercises: Stretches   Active Hamstring Stretch --   Active Hamstring Stretch Limitations --   Passive Hamstring Stretch 3 reps;30 seconds   Passive Hamstring Stretch Limitations bilat MET in supine   Single Knee to Chest Stretch 3 reps;30 seconds   Single Knee to Chest Stretch Limitations bilat   Double Knee to Chest Stretch Other (comment)   Double Knee to Chest Stretch Limitations 20x AAROM   Lower Trunk Rotation Other (comment)   Lower Trunk Rotation Limitations 10x5sec   Hip  Flexor Stretch 30 seconds;3 reps   Hip Flexor Stretch Limitations Quads stretch in prone, pelvis manually stabilized   Manual Therapy   Manual Therapy Joint mobilization   Joint Mobilization Gr-III PA Mobs L3, L2; Rotational Gr III L4 Bilat; regional PA mobilization lower Thoracic, mid thoracic spine   Myofascial Release Prone Hip ER stretch c release of Internal rotators. bilat 3x30                PT Education - 12/03/14 1444    Education provided Yes   Education Details Ativities that aggravate are not benefitial and should be excluded. Pt is determined to perform all activities regardless of pain that it may exacerbate.    Person(s) Educated Patient   Methods Explanation   Comprehension Need further instruction          PT Short Term Goals - 11/26/14 1534    PT SHORT TERM GOAL #1    Title Patient will demonstrate an improvement of at least 10 degrees in all lumbar plans of motion and bilateral hip IR, as well as improvement of lumbar rotation by at least 25%    Status On-going   PT SHORT TERM GOAL #2   Title Patient will demonstrate bilateral lower extremity strength of at least 4+/5 and proximal muscle/core strength of at least 4/5    Status On-going   PT [place] #3   Title Patient will experience no more than 2/10 pain at worst when it does occur in his R knee and low back    Status Achieved   PT SHORT TERM GOAL #4   Title Patient will be able to verbalize the importance of maintaining good siting and standing posture, and will be able to maintain good posture at least 80% of the time during all functional tasks and activities    Status On-going           PT Long Term Goals - 11/26/14 1535    PT LONG TERM GOAL #1   Title Patient will be independent in correctly and consistently performing appropriate HEP, to be advanced PRN    Status Achieved   PT LONG TERM GOAL #2   Title Patient will demonstrate correct form during all lifting tasks in order to reduce chances of recurrence of or exacerbation of pain    Status On-going   PT LONG TERM GOAL #3   Title Patient will demonstrate lumbar spine and bilateral hip ROM WNL for all planes measured   Status On-going   PT LONG TERM GOAL #4   Title Patient will experience 0/10 pain in his low back and R knee  during all functional tasks and activities                Plan - 12/03/14 1447    Clinical Impression Statement Pt reports overall progress adn improvement c back pain, but continues to present c intermittent episodes of increased pain. Pt encourages to remain diligent c HEP. Pt continues to demonstrate limited ROM and muscle tightness in Low back, as well as bilat hips. Pt making progress in pain management and daily functiona; activty tolerance. Pt will continue to beneft from skilled interventon to  improve pain, balance, and stretngth , to restore to PLOF.   Pt will benefit from skilled therapeutic intervention in order to improve on the following deficits Abnormal gait;Hypomobility;Decreased activity tolerance;Decreased strength;Pain;Decreased balance;Decreased mobility;Difficulty walking;Decreased range of motion;Improper body mechanics;Decreased coordination;Impaired flexibility;Postural dysfunction   Rehab Potential Good   PT  Frequency 2x / week   PT Duration 4 weeks   PT Treatment/Interventions ADLs/Self Care Home Management;Gait training;Stair training;Functional mobility training;Therapeutic activities;Therapeutic exercise;Balance training;Neuromuscular re-education;Patient/family education;Orthotic Fit/Training;Taping   PT Next Visit Plan Continue to focus on strengthening of core and LE musculature. Avoidance of extension oriented activities. F/U with Gt train using heel toe gt pattern.  Continue ITB work.  Trial with quadruped position exercises for gluteal and core strengthening next session.     PT Home Exercise Plan No changes, ask pt to improve compliance.    Consulted and Agree with Plan of Care Patient        Problem List Patient Active Problem List   Diagnosis Date Noted  . Vertigo 07/10/2014  . HTN (hypertension) 07/10/2014  . Peripheral vertigo 07/10/2014  . Neurogenic pain, leg 01/19/2014  . Normocytic anemia 06/24/2013  . GANGLION CYST 05/24/2010    Buccola,Allan C 12/03/2014, 2:52 PM  2:52 PM  Etta Grandchild, PT, DPT [place] License # [id number]       [facility] [address] [place], [place], [address] Phone: [phone]   Fax:  [phone]

## 2014-12-03 NOTE — Patient Instructions (Signed)
Improve compliance with HEP, rather than performing PRN/intermittently.

## 2014-12-08 ENCOUNTER — Ambulatory Visit (HOSPITAL_COMMUNITY): Payer: Commercial Managed Care - HMO | Admitting: Physical Therapy

## 2014-12-08 DIAGNOSIS — R269 Unspecified abnormalities of gait and mobility: Secondary | ICD-10-CM | POA: Diagnosis not present

## 2014-12-08 DIAGNOSIS — M25652 Stiffness of left hip, not elsewhere classified: Secondary | ICD-10-CM | POA: Diagnosis not present

## 2014-12-08 DIAGNOSIS — M545 Low back pain: Secondary | ICD-10-CM

## 2014-12-08 DIAGNOSIS — R2681 Unsteadiness on feet: Secondary | ICD-10-CM | POA: Diagnosis not present

## 2014-12-08 DIAGNOSIS — R293 Abnormal posture: Secondary | ICD-10-CM

## 2014-12-08 DIAGNOSIS — M25561 Pain in right knee: Secondary | ICD-10-CM | POA: Diagnosis not present

## 2014-12-08 DIAGNOSIS — M25651 Stiffness of right hip, not elsewhere classified: Secondary | ICD-10-CM

## 2014-12-08 DIAGNOSIS — M6281 Muscle weakness (generalized): Secondary | ICD-10-CM

## 2014-12-08 DIAGNOSIS — R29898 Other symptoms and signs involving the musculoskeletal system: Secondary | ICD-10-CM | POA: Diagnosis not present

## 2014-12-08 DIAGNOSIS — M6289 Other specified disorders of muscle: Secondary | ICD-10-CM | POA: Diagnosis not present

## 2014-12-08 NOTE — Therapy (Signed)
[facility] [address] [place], [place], [address] Phone: [phone]   Fax:  [phone]  Physical Therapy Treatment (Re-Assessment)  Patient Details  Name: Jeremiah Murphy MRN: [id number] Date of Birth: 02/27/1941 Referring Provider:  Catalina Pizza, MD  Encounter Date: 12/08/2014      PT End of Session - 12/08/14 1808    Visit Number 14   Number of Visits 24   Date for PT Re-Evaluation 01/05/15   Authorization Type [employer]vantage; G-codes done 14th session    Authorization Time Period 09/25/14 to 11/25/14; recert done 8/4 to 10/4   Authorization - Visit Number 14   Authorization - Number of Visits 24   PT Start Time 1515   PT Stop Time 1558   PT Time Calculation (min) 43 min   Activity Tolerance Patient tolerated treatment well   Behavior During Therapy [facility] for tasks assessed/performed      Past Medical History  Diagnosis Date  . HTN (hypertension)   . Dyslipidemia   . Normocytic anemia     Past Surgical History  Procedure Laterality Date  . Carpal tunnel release  1970    left    There were no vitals filed for this visit.  Visit Diagnosis:  Bilateral low back pain, with sciatica presence unspecified  Abnormality of gait  Proximal muscle weakness  Hip stiffness, right  Right knee pain  Leg weakness, bilateral  Poor posture  Unsteadiness  Hip stiffness, left      Subjective Assessment - 12/08/14 1521    Subjective Patient reports he is feeling good today, no real concerns but is still having some pain in R knee    Pertinent History Patient states that the pain in his back and knee in the fall of 2015; patient retired in [place] then went back to work in [employer] and came home with whole R side bruised. Never went to MD to get checked out; just last year started to complain of more pain in back and knee  as well as edema in R foot. Having pain on inner side of knee.    How long can you sit  comfortably? 8/16- unlimited    How long can you stand comfortably? 8/16- 60 minutes    How long can you walk comfortably? 8/16- 2 hours    Patient Stated Goals get rid of pain in back and leg    Currently in Pain? Yes   Pain Score 3    Pain Location Knee   Pain Orientation Right            [facility] PT Assessment - 12/08/14 0001    Observation/Other Assessments   Focus on Therapeutic Outcomes (FOTO)  33% limited    AROM   Right Hip External Rotation  --  WFL    Right Hip Internal Rotation  30   Left Hip External Rotation  --  [facility]    Left Hip Internal Rotation  33   Lumbar Flexion 65   Lumbar Extension 19   Lumbar - Right Side Bend 27   Lumbar - Left Side Bend 28   Lumbar - Right Rotation --  approx 20% limited    Lumbar - Left Rotation approx 20% limited    Strength   Right Hip Flexion 5/5   Right Hip Extension 3-/5   Right Hip ABduction 4-/5   Left Hip Flexion 5/5   Left Hip Extension 3-/5   Left Hip ABduction 4-/5  Right Knee Flexion 4/5   Right Knee Extension 5/5   Left Knee Flexion 4-/5   Left Knee Extension 4+/5   Right Ankle Dorsiflexion 5/5   Left Ankle Dorsiflexion 5/5                     [facility] Adult PT Treatment/Exercise - 12/08/14 0001    Knee/Hip Exercises: Stretches   Active Hamstring Stretch Both;2 reps;30 seconds   Active Hamstring Stretch Limitations 12 inch step    Piriformis Stretch Both;2 reps;30 seconds   Piriformis Stretch Limitations seated   Gastroc Stretch Both;3 reps;30 seconds   Gastroc Stretch Limitations slantboard    Knee/Hip Exercises: Aerobic   Stationary Bike Nustep hills 4 level 2 10 minutes                 PT Education - 12/08/14 1807    Education provided Yes   Education Details progress with skilled PT services, plan of care moving forward    Person(s) Educated Patient   Methods Explanation   Comprehension Verbalized understanding          PT Short Term Goals - 12/08/14 1544    PT SHORT TERM  GOAL #1   Title Patient will demonstrate an improvement of at least 10 degrees in all lumbar plans of motion and bilateral hip IR, as well as improvement of lumbar rotation by at least 25%    Time 4   Period Weeks   Status On-going   PT SHORT TERM GOAL #2   Title Patient will demonstrate bilateral lower extremity strength of at least 4+/5 and proximal muscle/core strength of at least 4/5    Time 4   Period Weeks   Status On-going   PT SHORT TERM GOAL #3   Title Patient will experience no more than 2/10 pain at worst when it does occur in his R knee and low back    Time 4   Period Weeks   Status Achieved   PT SHORT TERM GOAL #4   Title Patient will be able to verbalize the importance of maintaining good siting and standing posture, and will be able to maintain good posture at least 80% of the time during all functional tasks and activities    Time 4   Period Weeks   Status On-going           PT Long Term Goals - 12/08/14 1545    PT LONG TERM GOAL #1   Title Patient will be independent in correctly and consistently performing appropriate HEP, to be advanced PRN    Baseline 8/16- not doing exercises every day    Time 8   Period Weeks   Status On-going   PT LONG TERM GOAL #2   Title Patient will demonstrate correct form during all lifting tasks in order to reduce chances of recurrence of or exacerbation of pain    Time 8   Period Weeks   Status On-going   PT LONG TERM GOAL #3   Title Patient will demonstrate lumbar spine and bilateral hip ROM WNL for all planes measured   Time 8   Period Weeks   Status On-going   PT LONG TERM GOAL #4   Title Patient will experience 0/10 pain in his low back and R knee  during all functional tasks and activities    Time 8   Period Weeks   Status On-going  Plan - 2015-01-06 1809    Clinical Impression Statement Re-assessment performed today. Patient continues to demonstrate stiffness in lumbar spine and bilateral hips,  bilateral lower extremity/proximal msucle/core weakness, R knee pain, postural and gait impairment, and reduced functional task performance skills. At this time patient will benefit from a continuation of skilled PT services to continue to attempt to address impairments, with potential DC to HEP for home management after approx 8 more treatment sessions.    Pt will benefit from skilled therapeutic intervention in order to improve on the following deficits Abnormal gait;Hypomobility;Decreased activity tolerance;Decreased strength;Pain;Decreased balance;Decreased mobility;Difficulty walking;Decreased range of motion;Improper body mechanics;Decreased coordination;Impaired flexibility;Postural dysfunction   Rehab Potential Good   PT Frequency 2x / week   PT Duration 4 weeks   PT Treatment/Interventions ADLs/Self Care Home Management;Gait training;Stair training;Functional mobility training;Therapeutic activities;Therapeutic exercise;Balance training;Neuromuscular re-education;Patient/family education;Orthotic Fit/Training;Taping   PT Next Visit Plan Continue to focus on strengthening of core and LE musculature. Avoidance of extension oriented activities. F/U with Gt train using heel toe gt pattern.  Continue ITB work.  Trial with quadruped position exercises for gluteal and core strengthening next session.     PT Home Exercise Plan No changes, ask pt to improve compliance.    Consulted and Agree with Plan of Care Patient          G-Codes - 2015-01-06 1812    Functional Limitation Mobility: Walking and moving around   Mobility: Walking and Moving Around Current Status [phone]) At least 20 percent but less than 40 percent impaired, limited or restricted   Mobility: Walking and Moving Around Goal Status [phone]) At least 1 percent but less than 20 percent impaired, limited or restricted      Problem List Patient Active Problem List   Diagnosis Date Noted  . Vertigo 07/10/2014  . HTN (hypertension)  07/10/2014  . Peripheral vertigo 07/10/2014  . Neurogenic pain, leg 01/19/2014  . Normocytic anemia 06/24/2013  . GANGLION CYST 05/24/2010   Physical Therapy Progress Note  Dates of Reporting Period: 11/03/14 to January 06, 2015  Objective Reports of Subjective Statement: Patient reports he continues to be limited by pain in his R knee, although the pain in his back is doing better; also reports that he feels like he needs to get stronger   Objective Measurements: see above   Goal Update: see above   Plan: see above   Reason Skilled Services are Required: lumbar and bilateral hip stiffness, bilateral lower extremity and proximal/core muscle weakness, postural and gait impairment, pain, reduced functional task performance     Nedra Hai PT, DPT [phone]  [facility] [address] [place], [place], [address] Phone: [phone]   Fax:  [phone]

## 2014-12-10 ENCOUNTER — Ambulatory Visit (HOSPITAL_COMMUNITY): Payer: Commercial Managed Care - HMO

## 2014-12-10 DIAGNOSIS — M545 Low back pain: Secondary | ICD-10-CM | POA: Diagnosis not present

## 2014-12-10 DIAGNOSIS — R2681 Unsteadiness on feet: Secondary | ICD-10-CM | POA: Diagnosis not present

## 2014-12-10 DIAGNOSIS — R293 Abnormal posture: Secondary | ICD-10-CM

## 2014-12-10 DIAGNOSIS — M25652 Stiffness of left hip, not elsewhere classified: Secondary | ICD-10-CM | POA: Diagnosis not present

## 2014-12-10 DIAGNOSIS — M25561 Pain in right knee: Secondary | ICD-10-CM | POA: Diagnosis not present

## 2014-12-10 DIAGNOSIS — R269 Unspecified abnormalities of gait and mobility: Secondary | ICD-10-CM | POA: Diagnosis not present

## 2014-12-10 DIAGNOSIS — M6281 Muscle weakness (generalized): Secondary | ICD-10-CM

## 2014-12-10 DIAGNOSIS — R2689 Other abnormalities of gait and mobility: Secondary | ICD-10-CM

## 2014-12-10 DIAGNOSIS — R29898 Other symptoms and signs involving the musculoskeletal system: Secondary | ICD-10-CM | POA: Diagnosis not present

## 2014-12-10 DIAGNOSIS — M25651 Stiffness of right hip, not elsewhere classified: Secondary | ICD-10-CM | POA: Diagnosis not present

## 2014-12-10 DIAGNOSIS — M6289 Other specified disorders of muscle: Secondary | ICD-10-CM | POA: Diagnosis not present

## 2014-12-10 NOTE — Therapy (Signed)
[facility] [address] [place], [place], [address] Phone: [phone]   Fax:  [phone]  Physical Therapy Treatment  Patient Details  Name: Jeremiah Murphy MRN: [id number] Date of Birth: February 25, 1941 Referring Provider:  Kathryne Hitch*  Encounter Date: 12/10/2014      PT End of Session - 12/10/14 1358    Visit Number 15   Number of Visits 24   Date for PT Re-Evaluation 01/05/15   Authorization Type [employer]vantage; G-codes done 14th session    Authorization Time Period 09/25/14 to 11/25/14; recert done 8/4 to 10/4   Authorization - Visit Number 15   Authorization - Number of Visits 24   PT Start Time 1350   PT Stop Time 1430   PT Time Calculation (min) 40 min   Activity Tolerance Patient tolerated treatment well   Behavior During Therapy [facility] for tasks assessed/performed      Past Medical History  Diagnosis Date  . HTN (hypertension)   . Dyslipidemia   . Normocytic anemia     Past Surgical History  Procedure Laterality Date  . Carpal tunnel release  1970    left    There were no vitals filed for this visit.  Visit Diagnosis:  Bilateral low back pain, with sciatica presence unspecified  Abnormality of gait  Proximal muscle weakness  Hip stiffness, right  Right knee pain  Leg weakness, bilateral  Poor posture  Unsteadiness  Hip stiffness, left  Poor balance      Subjective Assessment - 12/10/14 1353    Subjective Pt reports he is pain free today, legs feel really tight.  Did a lot of walking yesterday.   Currently in Pain? No/denies                         [facility] Adult PT Treatment/Exercise - 12/10/14 0001    Ambulation/Gait   Ambulation/Gait Yes   Ambulation/Gait Assistance 6: Modified independent (Device/Increase time)   Ambulation Distance (Feet) 520 Feet   Assistive device --  utilized dowel rod each UE to imrpove trunk rotation with ga   Gait  Pattern --  decreased trunk rotation with gait   Gait Comments Poor trunk rotation with gait   Lumbar Exercises: Stretches   Active Hamstring Stretch 3 reps;30 seconds   Active Hamstring Stretch Limitations 14in step   Lower Trunk Rotation Limitations 10x5sec   Hip Flexor Stretch 30 seconds;3 reps   Hip Flexor Stretch Limitations Quads stretch in prone, pelvis manually stabilized   Quadruped Mid Back Stretch 3 reps;20 seconds   Lumbar Exercises: Standing   Other Standing Lumbar Exercises 3D hip excursions 1x15   Lumbar Exercises: Quadruped   Madcat/Old Horse 10 reps   Single Arm Raise Right;Left;10 reps;5 seconds   Straight Leg Raise 10 reps;5 seconds   Opposite Arm/Leg Raise Right arm/Left leg;Left arm/Right leg;5 reps;5 seconds   Knee/Hip Exercises: Stretches   Gastroc Stretch Both;3 reps;30 seconds   Gastroc Stretch Limitations slantboard                   PT Short Term Goals - 12/08/14 1544    PT SHORT TERM GOAL #1   Title Patient will demonstrate an improvement of at least 10 degrees in all lumbar plans of motion and bilateral hip IR, as well as improvement of lumbar rotation by at least 25%    Time 4   Period Weeks   Status On-going  PT SHORT TERM GOAL #2   Title Patient will demonstrate bilateral lower extremity strength of at least 4+/5 and proximal muscle/core strength of at least 4/5    Time 4   Period Weeks   Status On-going   PT SHORT TERM GOAL #3   Title Patient will experience no more than 2/10 pain at worst when it does occur in his R knee and low back    Time 4   Period Weeks   Status Achieved   PT SHORT TERM GOAL #4   Title Patient will be able to verbalize the importance of maintaining good siting and standing posture, and will be able to maintain good posture at least 80% of the time during all functional tasks and activities    Time 4   Period Weeks   Status On-going           PT Long Term Goals - 12/08/14 1545    PT LONG TERM GOAL #1    Title Patient will be independent in correctly and consistently performing appropriate HEP, to be advanced PRN    Baseline 8/16- not doing exercises every day    Time 8   Period Weeks   Status On-going   PT LONG TERM GOAL #2   Title Patient will demonstrate correct form during all lifting tasks in order to reduce chances of recurrence of or exacerbation of pain    Time 8   Period Weeks   Status On-going   PT LONG TERM GOAL #3   Title Patient will demonstrate lumbar spine and bilateral hip ROM WNL for all planes measured   Time 8   Period Weeks   Status On-going   PT LONG TERM GOAL #4   Title Patient will experience 0/10 pain in his low back and R knee  during all functional tasks and activities    Time 8   Period Weeks   Status On-going               Plan - 12/10/14 1404    Clinical Impression Statement Pt continues to present very rigid in lumbar spine and bilater hips as well as weak core musclurate affecting his gait mechanics.  Continued with stretches and hip excursion exercises to improve hip mobility.  Added mad cat/camel to improve spinal mobility.  Began quadruped exercises for core and gluteal strenghtening with min cueing  for stabilty.  Utilized dowel rods with gait training this session to improve trunk rotation, pt able to demonstrate improve UE swing with heel strike opposite LE following gait training.     PT Next Visit Plan Continue to focus on strengthening of core and LE musculature. Avoidance of extension oriented activities. F/U with Gt train using heel toe gt pattern.  Continue ITB work.  Continue with quadruped position exercises for gluteal and core strengthening next session.          Problem List Patient Active Problem List   Diagnosis Date Noted  . Vertigo 07/10/2014  . HTN (hypertension) 07/10/2014  . Peripheral vertigo 07/10/2014  . Neurogenic pain, leg 01/19/2014  . Normocytic anemia 06/24/2013  . GANGLION CYST 05/24/2010   Becky Sax, LPTA; CBIS [phone]  Juel Burrow 12/10/2014, 2:32 PM  [place] [facility] [address] [place], [place], [address] Phone: [phone]   Fax:  [phone]

## 2014-12-15 ENCOUNTER — Ambulatory Visit (HOSPITAL_COMMUNITY): Payer: Commercial Managed Care - HMO | Admitting: Physical Therapy

## 2014-12-15 DIAGNOSIS — M545 Low back pain: Secondary | ICD-10-CM

## 2014-12-15 DIAGNOSIS — R29898 Other symptoms and signs involving the musculoskeletal system: Secondary | ICD-10-CM | POA: Diagnosis not present

## 2014-12-15 DIAGNOSIS — R2681 Unsteadiness on feet: Secondary | ICD-10-CM

## 2014-12-15 DIAGNOSIS — R269 Unspecified abnormalities of gait and mobility: Secondary | ICD-10-CM | POA: Diagnosis not present

## 2014-12-15 DIAGNOSIS — M25651 Stiffness of right hip, not elsewhere classified: Secondary | ICD-10-CM | POA: Diagnosis not present

## 2014-12-15 DIAGNOSIS — R293 Abnormal posture: Secondary | ICD-10-CM

## 2014-12-15 DIAGNOSIS — M25652 Stiffness of left hip, not elsewhere classified: Secondary | ICD-10-CM

## 2014-12-15 DIAGNOSIS — M25561 Pain in right knee: Secondary | ICD-10-CM

## 2014-12-15 DIAGNOSIS — M6281 Muscle weakness (generalized): Secondary | ICD-10-CM

## 2014-12-15 DIAGNOSIS — M6289 Other specified disorders of muscle: Secondary | ICD-10-CM | POA: Diagnosis not present

## 2014-12-15 NOTE — Therapy (Signed)
[facility] [address] [place], [place], [address] Phone: [phone]   Fax:  [phone]  Physical Therapy Treatment  Patient Details  Name: Jeremiah Murphy MRN: [id number] Date of Birth: 02-Feb-1941 Referring Provider:  Kathryne Hitch*  Encounter Date: 12/15/2014      PT End of Session - 12/15/14 1421    Visit Number 16   Number of Visits 24   Date for PT Re-Evaluation 01/05/15   Authorization Type [employer]vantage; G-codes done 14th session    Authorization Time Period 09/25/14 to 11/25/14; recert done 8/4 to 10/4   Authorization - Visit Number 16   Authorization - Number of Visits 24   PT Start Time 1347   PT Stop Time 1429   PT Time Calculation (min) 42 min   Activity Tolerance Patient tolerated treatment well   Behavior During Therapy [facility] for tasks assessed/performed      Past Medical History  Diagnosis Date  . HTN (hypertension)   . Dyslipidemia   . Normocytic anemia     Past Surgical History  Procedure Laterality Date  . Carpal tunnel release  1970    left    There were no vitals filed for this visit.  Visit Diagnosis:  Bilateral low back pain, with sciatica presence unspecified  Abnormality of gait  Proximal muscle weakness  Hip stiffness, right  Right knee pain  Leg weakness, bilateral  Poor posture  Unsteadiness  Hip stiffness, left      Subjective Assessment - 12/15/14 1350    Subjective Patient reports that he is doing much better, knee is pain free today but back is bothering him somewhat    Pertinent History Patient states that the pain in his back and knee in the fall of 2015; patient retired in [place] then went back to work in [employer] and came home with whole R side bruised. Never went to MD to get checked out; just last year started to complain of more pain in back and knee  as well as edema in R foot. Having pain on inner side of knee.    Currently in Pain?  Yes   Pain Score 3    Pain Location Back   Pain Orientation Lower                         [facility] Adult PT Treatment/Exercise - 12/15/14 0001    Lumbar Exercises: Stretches   Double Knee to Chest Stretch 30 seconds;3 reps   Double Knee to Chest Stretch Limitations supine    Lower Trunk Rotation Limitations 10x5sec    Lumbar Exercises: Standing   Other Standing Lumbar Exercises Opposite UE/LE flexion with contact guard, core activation 1x5 each side    Lumbar Exercises: Seated   Other Seated Lumbar Exercises seated UE/LE flexion while seated on air pad 1x10; gentle russian twists 1x10 with 0# on air pad    Lumbar Exercises: Quadruped   Madcat/Old Horse 10 reps   Single Arm Raise Right;Left;10 reps   Straight Leg Raise 10 reps   Opposite Arm/Leg Raise Left arm/Right leg;Right arm/Left leg;10 reps   Knee/Hip Exercises: Stretches   Active Hamstring Stretch Both;3 reps;30 seconds   Active Hamstring Stretch Limitations stairs    Piriformis Stretch Both;2 reps;30 seconds   Piriformis Stretch Limitations seated   Knee/Hip Exercises: Aerobic   Stationary Bike Nustep hills 4 level 2 10 minutes  PT Education - 12/15/14 1421    Education provided No          PT Short Term Goals - 12/08/14 1544    PT SHORT TERM GOAL #1   Title Patient will demonstrate an improvement of at least 10 degrees in all lumbar plans of motion and bilateral hip IR, as well as improvement of lumbar rotation by at least 25%    Time 4   Period Weeks   Status On-going   PT SHORT TERM GOAL #2   Title Patient will demonstrate bilateral lower extremity strength of at least 4+/5 and proximal muscle/core strength of at least 4/5    Time 4   Period Weeks   Status On-going   PT SHORT TERM GOAL #3   Title Patient will experience no more than 2/10 pain at worst when it does occur in his R knee and low back    Time 4   Period Weeks   Status Achieved   PT SHORT TERM GOAL #4    Title Patient will be able to verbalize the importance of maintaining good siting and standing posture, and will be able to maintain good posture at least 80% of the time during all functional tasks and activities    Time 4   Period Weeks   Status On-going           PT Long Term Goals - 12/08/14 1545    PT LONG TERM GOAL #1   Title Patient will be independent in correctly and consistently performing appropriate HEP, to be advanced PRN    Baseline 8/16- not doing exercises every day    Time 8   Period Weeks   Status On-going   PT LONG TERM GOAL #2   Title Patient will demonstrate correct form during all lifting tasks in order to reduce chances of recurrence of or exacerbation of pain    Time 8   Period Weeks   Status On-going   PT LONG TERM GOAL #3   Title Patient will demonstrate lumbar spine and bilateral hip ROM WNL for all planes measured   Time 8   Period Weeks   Status On-going   PT LONG TERM GOAL #4   Title Patient will experience 0/10 pain in his low back and R knee  during all functional tasks and activities    Time 8   Period Weeks   Status On-going               Plan - 12/15/14 1422    Clinical Impression Statement Focused on core strength and lumbar mobility today, as patient did complain of mild back pain at beginning of sesson. Continues to have difficulty with core exercises such as quadrupd and seated exercise on air pad, likely due to continued core weakness.    Pt will benefit from skilled therapeutic intervention in order to improve on the following deficits Abnormal gait;Hypomobility;Decreased activity tolerance;Decreased strength;Pain;Decreased balance;Decreased mobility;Difficulty walking;Decreased range of motion;Improper body mechanics;Decreased coordination;Impaired flexibility;Postural dysfunction   Rehab Potential Good   PT Frequency 2x / week   PT Duration 4 weeks   PT Treatment/Interventions ADLs/Self Care Home Management;Gait training;Stair  training;Functional mobility training;Therapeutic activities;Therapeutic exercise;Balance training;Neuromuscular re-education;Patient/family education;Orthotic Fit/Training;Taping   PT Next Visit Plan Continue to focus on strengthening of core and LE musculature. Avoidance of extension oriented activities. F/U with Gt train using heel toe gt pattern.  Continue ITB work.  Continue with quadruped position exercises for gluteal and core strengthening next session.  PT Home Exercise Plan No changes, ask pt to improve compliance.    Consulted and Agree with Plan of Care Patient        Problem List Patient Active Problem List   Diagnosis Date Noted  . Vertigo 07/10/2014  . HTN (hypertension) 07/10/2014  . Peripheral vertigo 07/10/2014  . Neurogenic pain, leg 01/19/2014  . Normocytic anemia 06/24/2013  . GANGLION CYST 05/24/2010    Nedra Hai PT, DPT [phone]  [facility] [address] [place], [place], [address] Phone: [phone]   Fax:  [phone]

## 2014-12-16 ENCOUNTER — Ambulatory Visit: Payer: Commercial Managed Care - HMO | Admitting: Nurse Practitioner

## 2014-12-17 ENCOUNTER — Ambulatory Visit (HOSPITAL_COMMUNITY): Payer: Commercial Managed Care - HMO | Admitting: Physical Therapy

## 2014-12-17 DIAGNOSIS — M25651 Stiffness of right hip, not elsewhere classified: Secondary | ICD-10-CM | POA: Diagnosis not present

## 2014-12-17 DIAGNOSIS — M25561 Pain in right knee: Secondary | ICD-10-CM

## 2014-12-17 DIAGNOSIS — M6281 Muscle weakness (generalized): Secondary | ICD-10-CM

## 2014-12-17 DIAGNOSIS — R29898 Other symptoms and signs involving the musculoskeletal system: Secondary | ICD-10-CM

## 2014-12-17 DIAGNOSIS — M545 Low back pain: Secondary | ICD-10-CM

## 2014-12-17 DIAGNOSIS — R2681 Unsteadiness on feet: Secondary | ICD-10-CM

## 2014-12-17 DIAGNOSIS — M6289 Other specified disorders of muscle: Secondary | ICD-10-CM | POA: Diagnosis not present

## 2014-12-17 DIAGNOSIS — M25652 Stiffness of left hip, not elsewhere classified: Secondary | ICD-10-CM

## 2014-12-17 DIAGNOSIS — R269 Unspecified abnormalities of gait and mobility: Secondary | ICD-10-CM

## 2014-12-17 DIAGNOSIS — R293 Abnormal posture: Secondary | ICD-10-CM

## 2014-12-17 NOTE — Therapy (Signed)
[facility] [address] [place], [place], [address] Phone: [phone]   Fax:  [phone]  Physical Therapy Treatment  Patient Details  Name: Jeremiah Murphy MRN: [id number] Date of Birth: 1940-11-16 Referring Provider:  Kathryne Hitch*  Encounter Date: 12/17/2014      PT End of Session - 12/17/14 1334    Visit Number 17   Number of Visits 24   Date for PT Re-Evaluation 01/05/15   Authorization Type [employer] Advantage; G-codes done 14th session    Authorization Time Period 09/25/14 to 11/25/14; recert done 8/4 to 10/4   Authorization - Visit Number 17   Authorization - Number of Visits 24   PT Start Time 1301   PT Stop Time 1341   PT Time Calculation (min) 40 min   Activity Tolerance Patient tolerated treatment well   Behavior During Therapy [facility] for tasks assessed/performed      Past Medical History  Diagnosis Date  . HTN (hypertension)   . Dyslipidemia   . Normocytic anemia     Past Surgical History  Procedure Laterality Date  . Carpal tunnel release  1970    left    There were no vitals filed for this visit.  Visit Diagnosis:  Bilateral low back pain, with sciatica presence unspecified  Abnormality of gait  Proximal muscle weakness  Hip stiffness, right  Right knee pain  Leg weakness, bilateral  Poor posture  Unsteadiness  Hip stiffness, left      Subjective Assessment - 12/17/14 1303    Subjective Patient reports he is doing much better, not having any pain today and only had a little bit of pain yesterday.    Pertinent History Patient states that the pain in his back and knee in the fall of 2015; patient retired in [place] then went back to work in [employer] and came home with whole R side bruised. Never went to MD to get checked out; just last year started to complain of more pain in back and knee  as well as edema in R foot. Having pain on inner side of knee.    Currently in  Pain? No/denies                         [facility] Adult PT Treatment/Exercise - 12/17/14 0001    Lumbar Exercises: Stretches   Double Knee to Chest Stretch 30 seconds;3 reps   Double Knee to Chest Stretch Limitations supine    Lower Trunk Rotation Limitations 5x10 seconds each side    Lumbar Exercises: Seated   Other Seated Lumbar Exercises seated UE/LE flexion while seated on air pad 1x10; gentle russian twists 1x10 with 0# on air pad ; seated marches with overhead press  1x10 on air pad    Lumbar Exercises: Quadruped   Madcat/Old Horse 15 reps   Single Arm Raise Right;Left;15 reps   Straight Leg Raise 15 reps   Opposite Arm/Leg Raise Right arm/Left leg;Left arm/Right leg;15 reps   Knee/Hip Exercises: Stretches   Active Hamstring Stretch Both;3 reps;30 seconds   Active Hamstring Stretch Limitations 12 inch box    Piriformis Stretch Both;2 reps;30 seconds   Piriformis Stretch Limitations seated   Gastroc Stretch Both;3 reps;30 seconds   Gastroc Stretch Limitations slantboard    Knee/Hip Exercises: Aerobic   Tread Mill 8 minutes at 5% grade, 1.2-1.5 MPH with ab squeezes  PT Education - 12/17/14 1333    Education provided Yes   Education Details patient educated that if he stays pain free or continues to have very low pain in general, DC may take place in next 2-3 sessions    Person(s) Educated Patient   Methods Explanation   Comprehension Verbalized understanding          PT Short Term Goals - 12/08/14 1544    PT SHORT TERM GOAL #1   Title Patient will demonstrate an improvement of at least 10 degrees in all lumbar plans of motion and bilateral hip IR, as well as improvement of lumbar rotation by at least 25%    Time 4   Period Weeks   Status On-going   PT SHORT TERM GOAL #2   Title Patient will demonstrate bilateral lower extremity strength of at least 4+/5 and proximal muscle/core strength of at least 4/5    Time 4   Period Weeks    Status On-going   PT SHORT TERM GOAL #3   Title Patient will experience no more than 2/10 pain at worst when it does occur in his R knee and low back    Time 4   Period Weeks   Status Achieved   PT SHORT TERM GOAL #4   Title Patient will be able to verbalize the importance of maintaining good siting and standing posture, and will be able to maintain good posture at least 80% of the time during all functional tasks and activities    Time 4   Period Weeks   Status On-going           PT Long Term Goals - 12/08/14 1545    PT LONG TERM GOAL #1   Title Patient will be independent in correctly and consistently performing appropriate HEP, to be advanced PRN    Baseline 8/16- not doing exercises every day    Time 8   Period Weeks   Status On-going   PT LONG TERM GOAL #2   Title Patient will demonstrate correct form during all lifting tasks in order to reduce chances of recurrence of or exacerbation of pain    Time 8   Period Weeks   Status On-going   PT LONG TERM GOAL #3   Title Patient will demonstrate lumbar spine and bilateral hip ROM WNL for all planes measured   Time 8   Period Weeks   Status On-going   PT LONG TERM GOAL #4   Title Patient will experience 0/10 pain in his low back and R knee  during all functional tasks and activities    Time 8   Period Weeks   Status On-going               Plan - 12/17/14 1335    Clinical Impression Statement Continued focus on core strength and lumbar mobility with increased reps; continues to be challenged by core activities in quadruped and with seated exercise on air pad. Introduced gait on treadmill today with speed at 1.2-1.5 MPH and 5% grade, core sets throuhougt with good tolerance.  Consider DC in next 3 sessions if pain stays at 0/10 or very low in general.    Pt will benefit from skilled therapeutic intervention in order to improve on the following deficits Abnormal gait;Hypomobility;Decreased activity tolerance;Decreased  strength;Pain;Decreased balance;Decreased mobility;Difficulty walking;Decreased range of motion;Improper body mechanics;Decreased coordination;Impaired flexibility;Postural dysfunction   Rehab Potential Good   PT Frequency 2x / week   PT Duration 4 weeks  PT Treatment/Interventions ADLs/Self Care Home Management;Gait training;Stair training;Functional mobility training;Therapeutic activities;Therapeutic exercise;Balance training;Neuromuscular re-education;Patient/family education;Orthotic Fit/Training;Taping   PT Next Visit Plan Continue to focus on strengthening of core and LE musculature. Avoidance of extension oriented activities. F/U with Gt train using heel toe gt pattern.  Continue ITB work.  Continue with quadruped position exercises for gluteal and core strengthening next session.     PT Home Exercise Plan No changes, ask pt to improve compliance.    Consulted and Agree with Plan of Care Patient        Problem List Patient Active Problem List   Diagnosis Date Noted  . Vertigo 07/10/2014  . HTN (hypertension) 07/10/2014  . Peripheral vertigo 07/10/2014  . Neurogenic pain, leg 01/19/2014  . Normocytic anemia 06/24/2013  . GANGLION CYST 05/24/2010    Nedra Hai PT, DPT [phone]  [facility] [address] [place], [place], [address] Phone: [phone]   Fax:  [phone]

## 2014-12-22 ENCOUNTER — Ambulatory Visit (HOSPITAL_COMMUNITY): Payer: Commercial Managed Care - HMO | Admitting: Physical Therapy

## 2014-12-22 DIAGNOSIS — M545 Low back pain: Secondary | ICD-10-CM | POA: Diagnosis not present

## 2014-12-22 DIAGNOSIS — R2681 Unsteadiness on feet: Secondary | ICD-10-CM

## 2014-12-22 DIAGNOSIS — R269 Unspecified abnormalities of gait and mobility: Secondary | ICD-10-CM | POA: Diagnosis not present

## 2014-12-22 DIAGNOSIS — M25651 Stiffness of right hip, not elsewhere classified: Secondary | ICD-10-CM | POA: Diagnosis not present

## 2014-12-22 DIAGNOSIS — R29898 Other symptoms and signs involving the musculoskeletal system: Secondary | ICD-10-CM

## 2014-12-22 DIAGNOSIS — M25561 Pain in right knee: Secondary | ICD-10-CM | POA: Diagnosis not present

## 2014-12-22 DIAGNOSIS — M6289 Other specified disorders of muscle: Secondary | ICD-10-CM | POA: Diagnosis not present

## 2014-12-22 DIAGNOSIS — R293 Abnormal posture: Secondary | ICD-10-CM | POA: Diagnosis not present

## 2014-12-22 DIAGNOSIS — M25652 Stiffness of left hip, not elsewhere classified: Secondary | ICD-10-CM

## 2014-12-22 DIAGNOSIS — M6281 Muscle weakness (generalized): Secondary | ICD-10-CM

## 2014-12-22 NOTE — Therapy (Signed)
[facility] [address] [place], [place], [address] Phone: [phone]   Fax:  [phone]  Physical Therapy Treatment (Discharge Assessment)  Patient Details  Name: Jeremiah Murphy MRN: [id number] Date of Birth: January 17, 1941 Referring Provider:  Delphina Cahill, MD  Encounter Date: 12/22/2014      PT End of Session - 12/22/14 1607    Visit Number 18   Number of Visits 24   Authorization Type [employer] H[employer] Advantage; G-codes done 14th session    Authorization Time Period 09/25/14 to 49/17/91; recert done 8/4 to 50/5   Authorization - Visit Number 18   Authorization - Number of Visits 18   PT Start Time 6979   PT Stop Time 1558   PT Time Calculation (min) 41 min   Activity Tolerance Patient tolerated treatment well   Behavior During Therapy [facility] for tasks assessed/performed      Past Medical History  Diagnosis Date  . HTN (hypertension)   . Dyslipidemia   . Normocytic anemia     Past Surgical History  Procedure Laterality Date  . Carpal tunnel release  1970    left    There were no vitals filed for this visit.  Visit Diagnosis:  Bilateral low back pain, with sciatica presence unspecified  Abnormality of gait  Proximal muscle weakness  Hip stiffness, right  Right knee pain  Leg weakness, bilateral  Poor posture  Unsteadiness  Hip stiffness, left      Subjective Assessment - 12/22/14 1520    Subjective Patient reports he is doing well today, just a little bit of pain in his knee and reports that his MD thinks he is doing well and would like patient to stop therapy    Pertinent History Patient states that the pain in his back and knee in the fall of 2015; patient retired in [place] then went back to work in [employer] and came home with whole R side bruised. Never went to MD to get checked out; just last year started to complain of more pain in back and knee  as well as edema in R foot. Having pain on inner side  of knee.    How long can you sit comfortably? 8/29- no limits    How long can you stand comfortably? 8/29- at least 90 minutes to 2 hours    How long can you walk comfortably? 8/29- 2 hours    Patient Stated Goals get rid of pain in back and leg    Currently in Pain? Yes   Pain Score 2    Pain Location Knee   Pain Orientation Right            [facility] PT Assessment - 12/22/14 0001    Observation/Other Assessments   Focus on Therapeutic Outcomes (FOTO)  17% limited    AROM   Right Hip Internal Rotation  35   Left Hip Internal Rotation  30   Lumbar Flexion 60   Lumbar Extension 16   Lumbar - Right Side Bend 27   Lumbar - Left Side Bend 26   Strength   Right Hip Flexion 5/5   Right Hip Extension 3+/5   Right Hip ABduction 4/5   Left Hip Flexion 5/5   Left Hip Extension 3-/5   Left Hip ABduction 4+/5   Right Knee Flexion 4-/5   Right Knee Extension 5/5   Left Knee Flexion 4-/5   Left Knee Extension 5/5   Right Ankle Dorsiflexion 5/5  Left Ankle Dorsiflexion 5/5                     [facility] Adult PT Treatment/Exercise - 12/22/14 0001    Knee/Hip Exercises: Stretches   Active Hamstring Stretch Both;3 reps;30 seconds   Active Hamstring Stretch Limitations 12 inch box    Piriformis Stretch Both;3 reps;30 seconds   Piriformis Stretch Limitations seated   Gastroc Stretch Both;3 reps;30 seconds   Gastroc Stretch Limitations slantboard                 PT Education - 12/22/14 1607    Education provided Yes   Education Details DC today; advised to keep up with HEP and remain active    Person(s) Educated Patient   Methods Explanation   Comprehension Verbalized understanding          PT Short Term Goals - 12/22/14 1549    PT SHORT TERM GOAL #1   Title Patient will demonstrate an improvement of at least 10 degrees in all lumbar plans of motion and bilateral hip IR, as well as improvement of lumbar rotation by at least 25%    Time 4   Period Weeks    Status Not Met   PT SHORT TERM GOAL #2   Title Patient will demonstrate bilateral lower extremity strength of at least 4+/5 and proximal muscle/core strength of at least 4/5    Time 4   Period Weeks   Status Partially Met   PT SHORT TERM GOAL #3   Title Patient will experience no more than 2/10 pain at worst when it does occur in his R knee and low back    Baseline 8/29- pain can still reach 4-5/10   Time 4   Period Weeks   Status Not Met   PT SHORT TERM GOAL #4   Title Patient will be able to verbalize the importance of maintaining good siting and standing posture, and will be able to maintain good posture at least 80% of the time during all functional tasks and activities    Time 4   Period Weeks   Status Achieved           PT Long Term Goals - 12/22/14 1552    PT LONG TERM GOAL #1   Title Patient will be independent in correctly and consistently performing appropriate HEP, to be advanced PRN    Baseline 8/29- patient reports that he is doing them every night now    Time 8   Period Weeks   Status Achieved   PT LONG TERM GOAL #2   Title Patient will demonstrate correct form during all lifting tasks in order to reduce chances of recurrence of or exacerbation of pain    Baseline 8/29- demonstrated proper form at DC eval    Time 8   Period Weeks   Status Achieved   PT LONG TERM GOAL #3   Title Patient will demonstrate lumbar spine and bilateral hip ROM WNL for all planes measured   Time 8   Period Weeks   Status Not Met   PT LONG TERM GOAL #4   Title Patient will experience 0/10 pain in his low back and R knee  during all functional tasks and activities    Time 8   Period Weeks   Status Not Met               Plan - 12/22/14 1608    Clinical Impression Statement Discharge re-assessment performed today.  Patient shows improvement in overall strength but continues to demonstrate joint stiffness as well as some occasional pain in his back and knee. He reports that he  is feeling much better and has been able to do much more; encouraged to regualarly particiapte in activities and exercise at the [facility] on top of his HEP. Patient is no longer in need of skilled PT services and is to be DCed today.    Pt will benefit from skilled therapeutic intervention in order to improve on the following deficits Abnormal gait;Hypomobility;Decreased activity tolerance;Decreased strength;Pain;Decreased balance;Decreased mobility;Difficulty walking;Decreased range of motion;Improper body mechanics;Decreased coordination;Impaired flexibility;Postural dysfunction   Rehab Potential Good   PT Treatment/Interventions ADLs/Self Care Home Management;Gait training;Stair training;Functional mobility training;Therapeutic activities;Therapeutic exercise;Balance training;Neuromuscular re-education;Patient/family education;Orthotic Fit/Training;Taping   PT Next Visit Plan DC today    PT Home Exercise Plan No changes, ask pt to improve compliance.    Consulted and Agree with Plan of Care Patient          G-Codes - 29-Dec-2014 1610    Functional Assessment Tool Used Based on skilled clinical assessment of strength, ROM, pain, posture, gait, functional activity tolerance    Functional Limitation Mobility: Walking and moving around   Mobility: Walking and Moving Around Goal Status [phone]) At least 1 percent but less than 20 percent impaired, limited or restricted   Mobility: Walking and Moving Around Discharge Status [phone]) At least 20 percent but less than 40 percent impaired, limited or restricted      Problem List Patient Active Problem List   Diagnosis Date Noted  . Vertigo 07/10/2014  . HTN (hypertension) 07/10/2014  . Peripheral vertigo 07/10/2014  . Neurogenic pain, leg 01/19/2014  . Normocytic anemia 06/24/2013  . GANGLION CYST 05/24/2010   PHYSICAL THERAPY DISCHARGE SUMMARY  Visits from Start of Care: 18  Current functional level related to goals / functional  outcomes: Patient shows improved strength and functional activity tolerance/functional task performance skills, reduced limitation in task performance    Remaining deficits: Proximal muscle weakness, postural deficits, occasional knee/back pain   Education / Equipment: Encouraged to keep up with HEP, remain active at [facility] at least 3x/week  Plan: Patient agrees to discharge.  Patient goals were partially met. Patient is being discharged due to the physician's request.  ?????       Deniece Ree PT, DPT [place], [place], [address] Phone: [phone]   Fax:  [phone]

## 2014-12-24 ENCOUNTER — Encounter (HOSPITAL_COMMUNITY): Payer: Commercial Managed Care - HMO | Admitting: Physical Therapy

## 2014-12-24 DIAGNOSIS — Z23 Encounter for immunization: Secondary | ICD-10-CM | POA: Diagnosis not present

## 2014-12-29 ENCOUNTER — Encounter (HOSPITAL_COMMUNITY): Payer: Commercial Managed Care - HMO | Admitting: Physical Therapy

## 2014-12-31 ENCOUNTER — Encounter (HOSPITAL_COMMUNITY): Payer: Commercial Managed Care - HMO | Admitting: Physical Therapy

## 2015-01-05 ENCOUNTER — Encounter (HOSPITAL_COMMUNITY): Payer: Commercial Managed Care - HMO | Admitting: Physical Therapy

## 2015-01-07 ENCOUNTER — Ambulatory Visit: Payer: Commercial Managed Care - HMO | Admitting: Nurse Practitioner

## 2015-01-11 DIAGNOSIS — M545 Low back pain: Secondary | ICD-10-CM | POA: Diagnosis not present

## 2015-01-11 DIAGNOSIS — G5790 Unspecified mononeuropathy of unspecified lower limb: Secondary | ICD-10-CM | POA: Diagnosis not present

## 2015-02-04 ENCOUNTER — Other Ambulatory Visit (HOSPITAL_COMMUNITY): Payer: Self-pay | Admitting: Internal Medicine

## 2015-02-04 DIAGNOSIS — G629 Polyneuropathy, unspecified: Secondary | ICD-10-CM

## 2015-02-04 DIAGNOSIS — M5416 Radiculopathy, lumbar region: Secondary | ICD-10-CM

## 2015-02-19 ENCOUNTER — Ambulatory Visit (HOSPITAL_COMMUNITY)
Admission: RE | Admit: 2015-02-19 | Discharge: 2015-02-19 | Disposition: A | Discharge status: Home / Self Care | Payer: Commercial Managed Care - HMO | Source: Ambulatory Visit | Attending: Internal Medicine | Admitting: Internal Medicine

## 2015-02-19 DIAGNOSIS — M5416 Radiculopathy, lumbar region: Secondary | ICD-10-CM

## 2015-02-19 DIAGNOSIS — R937 Abnormal findings on diagnostic imaging of other parts of musculoskeletal system: Secondary | ICD-10-CM | POA: Diagnosis not present

## 2015-02-19 DIAGNOSIS — M545 Low back pain: Secondary | ICD-10-CM | POA: Insufficient documentation

## 2015-02-19 DIAGNOSIS — R200 Anesthesia of skin: Secondary | ICD-10-CM | POA: Insufficient documentation

## 2015-02-19 DIAGNOSIS — M5126 Other intervertebral disc displacement, lumbar region: Secondary | ICD-10-CM | POA: Diagnosis not present

## 2015-02-19 DIAGNOSIS — G629 Polyneuropathy, unspecified: Secondary | ICD-10-CM | POA: Diagnosis not present

## 2015-03-11 ENCOUNTER — Other Ambulatory Visit (HOSPITAL_COMMUNITY): Payer: Self-pay | Admitting: Internal Medicine

## 2015-03-11 DIAGNOSIS — I770 Arteriovenous fistula, acquired: Secondary | ICD-10-CM

## 2015-03-26 ENCOUNTER — Ambulatory Visit (HOSPITAL_COMMUNITY): Admission: RE | Admit: 2015-03-26 | Payer: Commercial Managed Care - HMO | Source: Ambulatory Visit

## 2015-03-29 ENCOUNTER — Ambulatory Visit (HOSPITAL_COMMUNITY): Admission: RE | Admit: 2015-03-29 | Payer: Commercial Managed Care - HMO | Source: Ambulatory Visit

## 2015-03-31 ENCOUNTER — Ambulatory Visit (HOSPITAL_COMMUNITY)
Admission: RE | Admit: 2015-03-31 | Discharge: 2015-03-31 | Disposition: A | Discharge status: Home / Self Care | Payer: Commercial Managed Care - HMO | Source: Ambulatory Visit | Attending: Internal Medicine | Admitting: Internal Medicine

## 2015-03-31 DIAGNOSIS — M545 Low back pain: Secondary | ICD-10-CM | POA: Diagnosis not present

## 2015-04-06 ENCOUNTER — Telehealth (HOSPITAL_COMMUNITY): Payer: Self-pay | Admitting: Interventional Radiology

## 2015-04-06 NOTE — Telephone Encounter (Signed)
Returned pt's call, left VM for him to call me back. JM

## 2015-04-08 ENCOUNTER — Ambulatory Visit (HOSPITAL_COMMUNITY): Payer: Commercial Managed Care - HMO

## 2015-04-13 ENCOUNTER — Other Ambulatory Visit (HOSPITAL_COMMUNITY): Payer: Self-pay | Admitting: Interventional Radiology

## 2015-04-13 DIAGNOSIS — I770 Arteriovenous fistula, acquired: Secondary | ICD-10-CM

## 2015-04-14 ENCOUNTER — Other Ambulatory Visit: Payer: Self-pay | Admitting: Radiology

## 2015-04-14 ENCOUNTER — Ambulatory Visit (HOSPITAL_COMMUNITY): Admission: RE | Admit: 2015-04-14 | Payer: Commercial Managed Care - HMO | Source: Ambulatory Visit

## 2015-04-14 ENCOUNTER — Encounter (HOSPITAL_COMMUNITY): Payer: Self-pay | Admitting: *Deleted

## 2015-04-14 NOTE — Progress Notes (Signed)
Anesthesia Chart Review: SAME DAY WORK-UP.  Patient is a 74 year old male scheduled for spinal angiogram with treatment tomorrow at 8AM by Dr. Corliss Skainseveshwar. DX: Spinal AVF.  History includes HTN, normocytic anemia, vertigo, non-smoker, dyslipidemia, arthritis, tonsillectomy, irregular heart rate, remote cardiac cath (possible intervention). See below for more details regarding cardiac history. PCP is Dr. Margo AyeHall.  PAT phone interviewing RN has been attempting to get records from Dr. Margo AyeHall for several hours (patient thinks Dr. Margo AyeHall has old cardiology records). No answer at his office, and she has only been able to leave a message. I tried as well without success. I subsequently called and spoke with patient. He was having some difficulty with answering my questions (unclear if it was a language barrier, hearing, or memory issues), so he had me speak with his wife Nettie ElmSylvia. She was able to provide the following details to the best of her knowledge. - Irregular heart rate was ~ '99 in the setting of an anxiety attack. He did not require a "shock" and did not require blood thinners other than ASA. The term atrial fibrillation DID NOT sound familiar. - Cardiac cath ~ 2002/2003 (patient was unsure if it was of the coronary arteries, but wife was fairly certain that it was). He had "one blocked artery" that was treated by "balloon" or "sucking out the blockage." This was done in [place].  Patient denied CP, SOB, no significant edema (reports what sounds like a varicose vein in his LLE). No palpitations/racing. Up until the last few colder months, he [facility]. He was riding on a stationary bike for 8-10 minutes, sometimes followed by 5-10 minutes on the treadmill, then followed by arm exercises. He could do this level of activity without CV symptoms.   Meds include atenolol, iron, simvastin.  07/10/14 EKG: SR, probable LAE, incomplete right BBB, LAFB. There are no comparison tracings in  EKG or Muse.  02/19/15 MRI L-spine: IMPRESSION: 1. Abnormal signal in the lower thoracic spinal cord especially from T12 to just above the conus at L2-L3. 10 mm focus of hemorrhage within the right hemicord at L1, and associated abnormal dural vessels tracking longitudinally along the right aspect of the thecal sac, and communicating with extra-spinal paravertebral vessels via the right side neural foramina at T12 and L1. 2. 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 3. Study discussed by telephone with Dr. Nita SellsJOHN HALL on 02/19/2015 at 1020 hours. We discussed Neuro-Endovascular follow-up for further evaluation and treatment planning.  He is for labs on arrival.  Discussed above with anesthesiologist Dr. Desmond Lopeurk. Patient reports recent reasonable exercise tolerance without CV symptoms. Further evaluation on the day of surgery to discuss the definitive anesthesia plan.   Velna Ochsllison Laurin Morgenstern, PA-C [facility]/Anesthesiology Phone [phone] 04/14/2015 5:01 PM

## 2015-04-14 NOTE — Progress Notes (Signed)
No pre-op orders in EPIC, paged Beckey DowningPam Turpin.

## 2015-04-14 NOTE — Progress Notes (Addendum)
Pt denies cardiac history, chest pain or sob. However, his wife states he had a "balloon" procedure in 1999. States he's not had any problems since and does not see a cardiologist.   Called pt back and asked where he had the "balloon" procedure and he said [place]. He states that Dr. Margo AyeHall has the records from [place]. They are closed for lunch at the present time, will call and request those records after 2 PM.

## 2015-04-15 ENCOUNTER — Ambulatory Visit (HOSPITAL_COMMUNITY): Admission: RE | Admit: 2015-04-15 | Payer: Commercial Managed Care - HMO | Source: Ambulatory Visit

## 2015-04-15 ENCOUNTER — Ambulatory Visit (HOSPITAL_COMMUNITY): Payer: Commercial Managed Care - HMO | Admitting: Emergency Medicine

## 2015-04-15 ENCOUNTER — Encounter (HOSPITAL_COMMUNITY)
Admission: RE | Disposition: A | Discharge status: Home / Self Care | Payer: Self-pay | Source: Ambulatory Visit | Attending: Interventional Radiology

## 2015-04-15 ENCOUNTER — Ambulatory Visit (HOSPITAL_COMMUNITY)
Admission: RE | Admit: 2015-04-15 | Discharge: 2015-04-15 | Disposition: A | Discharge status: Home / Self Care | Payer: Commercial Managed Care - HMO | Source: Ambulatory Visit | Attending: Interventional Radiology | Admitting: Interventional Radiology

## 2015-04-15 ENCOUNTER — Encounter (HOSPITAL_COMMUNITY): Payer: Self-pay | Admitting: Certified Registered Nurse Anesthetist

## 2015-04-15 DIAGNOSIS — G629 Polyneuropathy, unspecified: Secondary | ICD-10-CM | POA: Insufficient documentation

## 2015-04-15 DIAGNOSIS — I701 Atherosclerosis of renal artery: Secondary | ICD-10-CM | POA: Diagnosis not present

## 2015-04-15 DIAGNOSIS — E785 Hyperlipidemia, unspecified: Secondary | ICD-10-CM | POA: Insufficient documentation

## 2015-04-15 DIAGNOSIS — R42 Dizziness and giddiness: Secondary | ICD-10-CM | POA: Insufficient documentation

## 2015-04-15 DIAGNOSIS — M1990 Unspecified osteoarthritis, unspecified site: Secondary | ICD-10-CM | POA: Insufficient documentation

## 2015-04-15 DIAGNOSIS — I700 Atherosclerosis of aorta: Secondary | ICD-10-CM | POA: Insufficient documentation

## 2015-04-15 DIAGNOSIS — M5441 Lumbago with sciatica, right side: Secondary | ICD-10-CM

## 2015-04-15 DIAGNOSIS — I770 Arteriovenous fistula, acquired: Secondary | ICD-10-CM | POA: Insufficient documentation

## 2015-04-15 DIAGNOSIS — D649 Anemia, unspecified: Secondary | ICD-10-CM | POA: Insufficient documentation

## 2015-04-15 DIAGNOSIS — M5416 Radiculopathy, lumbar region: Secondary | ICD-10-CM | POA: Insufficient documentation

## 2015-04-15 DIAGNOSIS — M5442 Lumbago with sciatica, left side: Secondary | ICD-10-CM

## 2015-04-15 DIAGNOSIS — I671 Cerebral aneurysm, nonruptured: Secondary | ICD-10-CM | POA: Diagnosis not present

## 2015-04-15 DIAGNOSIS — M545 Low back pain: Secondary | ICD-10-CM | POA: Diagnosis not present

## 2015-04-15 DIAGNOSIS — I10 Essential (primary) hypertension: Secondary | ICD-10-CM | POA: Insufficient documentation

## 2015-04-15 DIAGNOSIS — G8929 Other chronic pain: Secondary | ICD-10-CM | POA: Insufficient documentation

## 2015-04-15 DIAGNOSIS — R209 Unspecified disturbances of skin sensation: Secondary | ICD-10-CM | POA: Diagnosis not present

## 2015-04-15 HISTORY — DX: Unspecified osteoarthritis, unspecified site: M19.90

## 2015-04-15 HISTORY — PX: RADIOLOGY WITH ANESTHESIA: SHX6223

## 2015-04-15 HISTORY — DX: Dizziness and giddiness: R42

## 2015-04-15 HISTORY — DX: Pneumonia, unspecified organism: J18.9

## 2015-04-15 HISTORY — DX: Cardiac arrhythmia, unspecified: I49.9

## 2015-04-15 LAB — CBC WITH DIFFERENTIAL/PLATELET
BASOS ABS: 0 10*3/uL (ref 0.0–0.1)
BASOS PCT: 0 %
Eosinophils Absolute: 0.1 10*3/uL (ref 0.0–0.7)
Eosinophils Relative: 2 %
HEMATOCRIT: 39.9 % (ref 39.0–52.0)
HEMOGLOBIN: 13.2 g/dL (ref 13.0–17.0)
Lymphocytes Relative: 16 %
Lymphs Abs: 1 10*3/uL (ref 0.7–4.0)
MCH: 31.2 pg (ref 26.0–34.0)
MCHC: 33.1 g/dL (ref 30.0–36.0)
MCV: 94.3 fL (ref 78.0–100.0)
MONOS PCT: 8 %
Monocytes Absolute: 0.5 10*3/uL (ref 0.1–1.0)
NEUTROS ABS: 5 10*3/uL (ref 1.7–7.7)
NEUTROS PCT: 74 %
Platelets: 178 10*3/uL (ref 150–400)
RBC: 4.23 MIL/uL (ref 4.22–5.81)
RDW: 13.6 % (ref 11.5–15.5)
WBC: 6.7 10*3/uL (ref 4.0–10.5)

## 2015-04-15 LAB — COMPREHENSIVE METABOLIC PANEL
ALBUMIN: 3.9 g/dL (ref 3.5–5.0)
ALK PHOS: 50 U/L (ref 38–126)
ALT: 16 U/L — ABNORMAL LOW (ref 17–63)
AST: 27 U/L (ref 15–41)
Anion gap: 9 (ref 5–15)
BILIRUBIN TOTAL: 0.8 mg/dL (ref 0.3–1.2)
BUN: 13 mg/dL (ref 6–20)
CO2: 24 mmol/L (ref 22–32)
CREATININE: 1.21 mg/dL (ref 0.61–1.24)
Calcium: 9.3 mg/dL (ref 8.9–10.3)
Chloride: 107 mmol/L (ref 101–111)
GFR calc Af Amer: >60 mL/min (ref >60)
GFR calc non Af Amer: 57 mL/min — ABNORMAL LOW (ref >60)
GLUCOSE: 112 mg/dL — AB (ref 65–99)
Potassium: 3.7 mmol/L (ref 3.5–5.1)
SODIUM: 140 mmol/L (ref 135–145)
Total Protein: 6.8 g/dL (ref 6.5–8.1)

## 2015-04-15 LAB — PROTIME-INR
INR: 1.08 (ref 0.00–1.49)
Prothrombin Time: 14.2 seconds (ref 11.6–15.2)

## 2015-04-15 LAB — APTT: APTT: 34 s (ref 24–37)

## 2015-04-15 LAB — POCT ACTIVATED CLOTTING TIME: Activated Clotting Time: 173 seconds

## 2015-04-15 SURGERY — RADIOLOGY WITH ANESTHESIA
Anesthesia: General

## 2015-04-15 MED ORDER — LIDOCAINE HCL (CARDIAC) 20 MG/ML IV SOLN
INTRAVENOUS | Status: DC | PRN
  Administered 2015-04-15: 20 mg via INTRAVENOUS

## 2015-04-15 MED ORDER — PROMETHAZINE HCL 25 MG/ML IJ SOLN: 6.2500 mg | INTRAMUSCULAR | Status: DC | PRN

## 2015-04-15 MED ORDER — SODIUM CHLORIDE 0.9 % IV SOLN: INTRAVENOUS | Status: DC

## 2015-04-15 MED ORDER — LIDOCAINE HCL 1 % IJ SOLN
INTRAMUSCULAR | Status: AC
  Filled 2015-04-15: qty 20

## 2015-04-15 MED ORDER — CEFAZOLIN SODIUM-DEXTROSE 2-3 GM-% IV SOLR
2.0000 g | INTRAVENOUS | Status: AC
  Administered 2015-04-15: 2 g via INTRAVENOUS
  Filled 2015-04-15 (×2): qty 50

## 2015-04-15 MED ORDER — EPHEDRINE SULFATE 50 MG/ML IJ SOLN
INTRAMUSCULAR | Status: DC | PRN
  Administered 2015-04-15 (×2): 5 mg via INTRAVENOUS

## 2015-04-15 MED ORDER — PROPOFOL 10 MG/ML IV BOLUS
INTRAVENOUS | Status: DC | PRN
  Administered 2015-04-15: 100 mg via INTRAVENOUS

## 2015-04-15 MED ORDER — IOHEXOL 300 MG/ML  SOLN
550.0000 mL | Freq: Once | INTRAMUSCULAR | Status: AC | PRN
  Administered 2015-04-15: 250 mL via INTRAVENOUS

## 2015-04-15 MED ORDER — DEXAMETHASONE SODIUM PHOSPHATE 4 MG/ML IJ SOLN
INTRAMUSCULAR | Status: DC | PRN
  Administered 2015-04-15: 10 mg via INTRAVENOUS

## 2015-04-15 MED ORDER — ASPIRIN 325 MG PO TABS: 325.0000 mg | ORAL_TABLET | Freq: Every day | ORAL | Status: DC

## 2015-04-15 MED ORDER — MEPERIDINE HCL 25 MG/ML IJ SOLN: 6.2500 mg | INTRAMUSCULAR | Status: DC | PRN

## 2015-04-15 MED ORDER — PANTOPRAZOLE SODIUM 40 MG IV SOLR
40.0000 mg | Freq: Once | INTRAVENOUS | Status: AC
  Administered 2015-04-15: 40 mg via INTRAVENOUS
  Filled 2015-04-15: qty 40

## 2015-04-15 MED ORDER — LACTATED RINGERS IV SOLN
INTRAVENOUS | Status: DC | PRN
  Administered 2015-04-15 (×2): via INTRAVENOUS

## 2015-04-15 MED ORDER — PHENYLEPHRINE HCL 10 MG/ML IJ SOLN
10.0000 mg | INTRAVENOUS | Status: DC | PRN
  Administered 2015-04-15: 20 ug/min via INTRAVENOUS

## 2015-04-15 MED ORDER — FENTANYL CITRATE (PF) 100 MCG/2ML IJ SOLN: 25.0000 ug | INTRAMUSCULAR | Status: DC | PRN

## 2015-04-15 MED ORDER — ROCURONIUM BROMIDE 100 MG/10ML IV SOLN
INTRAVENOUS | Status: DC | PRN
  Administered 2015-04-15: 10 mg via INTRAVENOUS
  Administered 2015-04-15: 40 mg via INTRAVENOUS

## 2015-04-15 MED ORDER — FENTANYL CITRATE (PF) 100 MCG/2ML IJ SOLN
INTRAMUSCULAR | Status: DC | PRN
  Administered 2015-04-15: 100 ug via INTRAVENOUS
  Administered 2015-04-15: 50 ug via INTRAVENOUS

## 2015-04-15 MED ORDER — ASPIRIN 325 MG PO TABS
ORAL_TABLET | ORAL | Status: AC
  Filled 2015-04-15: qty 1

## 2015-04-15 MED ORDER — HEPARIN SODIUM (PORCINE) 1000 UNIT/ML IJ SOLN
INTRAMUSCULAR | Status: DC | PRN
  Administered 2015-04-15: .5 mL via INTRAVENOUS
  Administered 2015-04-15: 3 mL via INTRAVENOUS

## 2015-04-15 MED ORDER — MIDAZOLAM HCL 5 MG/5ML IJ SOLN
INTRAMUSCULAR | Status: DC | PRN
  Administered 2015-04-15 (×2): 1 mg via INTRAVENOUS

## 2015-04-15 MED ORDER — SODIUM CHLORIDE 0.9 % IV SOLN: INTRAVENOUS | Status: AC

## 2015-04-15 MED ORDER — MIDAZOLAM HCL 2 MG/2ML IJ SOLN: 0.5000 mg | Freq: Once | INTRAMUSCULAR | Status: DC | PRN

## 2015-04-15 MED ORDER — ATENOLOL 25 MG PO TABS
25.0000 mg | ORAL_TABLET | Freq: Once | ORAL | Status: AC
  Administered 2015-04-15: 25 mg via ORAL
  Filled 2015-04-15: qty 1

## 2015-04-15 MED ORDER — SUGAMMADEX SODIUM 200 MG/2ML IV SOLN
INTRAVENOUS | Status: DC | PRN
  Administered 2015-04-15: 200 mg via INTRAVENOUS

## 2015-04-15 MED ORDER — ONDANSETRON HCL 4 MG/2ML IJ SOLN
INTRAMUSCULAR | Status: DC | PRN
  Administered 2015-04-15: 4 mg via INTRAVENOUS

## 2015-04-15 NOTE — Anesthesia Postprocedure Evaluation (Signed)
Anesthesia Post Note  Patient: Jeremiah ConstantDouglas A Kretzschmar  Procedure(s) Performed: Procedure(s) (LRB): SPINAL ANGIOGRAM WITH TREATMENT    (RADIOLOGY WITH ANESTHESIA) (N/A)  Patient location during evaluation: PACU Anesthesia Type: General Level of consciousness: awake and alert, oriented and patient cooperative Pain management: pain level controlled Vital Signs Assessment: post-procedure vital signs reviewed and stable Respiratory status: spontaneous breathing, nonlabored ventilation and respiratory function stable Cardiovascular status: blood pressure returned to baseline and stable Postop Assessment: no signs of nausea or vomiting Anesthetic complications: no    Last Vitals:  Filed Vitals:   04/15/15 1345 04/15/15 1400  BP: 116/78 121/73  Pulse: 90 89  Temp:  36.7 C  Resp: 14 20    Last Pain:  Filed Vitals:   04/15/15 1505  PainSc: 0-No pain                 Jaasia Viglione,E. Bailey Kolbe

## 2015-04-15 NOTE — Transfer of Care (Signed)
Immediate Anesthesia Transfer of Care Note  Patient: Jeremiah Murphy  Procedure(s) Performed: Procedure(s): SPINAL ANGIOGRAM WITH TREATMENT    (RADIOLOGY WITH ANESTHESIA) (N/A)  Patient Location: PACU  Anesthesia Type:General  Level of Consciousness: awake, alert , oriented and patient cooperative  Airway & Oxygen Therapy: Patient Spontanous Breathing and Patient connected to face mask oxygen  Post-op Assessment: Report given to RN, Post -op Vital signs reviewed and stable and Patient moving all extremities X 4  Post vital signs: Reviewed and stable  Last Vitals:  Filed Vitals:   04/15/15 0643  BP: 147/75  Pulse: 98  Temp: 36.3 C  Resp: 20    Complications: No apparent anesthesia complications

## 2015-04-15 NOTE — Sedation Documentation (Signed)
Care per anesthesia see anesthesia notes

## 2015-04-15 NOTE — Anesthesia Preprocedure Evaluation (Addendum)
Anesthesia Evaluation  Patient identified by MRN, date of birth, ID band Patient awake    Reviewed: Allergy & Precautions, NPO status , Patient's Chart, lab work & pertinent test results, reviewed documented beta blocker date and time   History of Anesthesia Complications Negative for: history of anesthetic complications  Airway Mallampati: I  TM Distance: >3 FB Neck ROM: Full    Dental  (+) Dental Advisory Given   Pulmonary neg pulmonary ROS,    breath sounds clear to auscultation       Cardiovascular hypertension, Pt. on medications and Pt. on home beta blockers (-) angina+ Peripheral Vascular Disease (spinal AVM)   Rhythm:Regular Rate:Normal     Neuro/Psych R leg pain    GI/Hepatic negative GI ROS, Neg liver ROS,   Endo/Other  negative endocrine ROS  Renal/GU negative Renal ROS     Musculoskeletal  (+) Arthritis , Osteoarthritis,    Abdominal   Peds  Hematology negative hematology ROS (+)   Anesthesia Other Findings   Reproductive/Obstetrics                            Anesthesia Physical Anesthesia Plan  ASA: III  Anesthesia Plan: General   Post-op Pain Management:    Induction: Intravenous  Airway Management Planned: Oral ETT  Additional Equipment:   Intra-op Plan:   Post-operative Plan: Extubation in OR  Informed Consent: I have reviewed the patients History and Physical, chart, labs and discussed the procedure including the risks, benefits and alternatives for the proposed anesthesia with the patient or authorized representative who has indicated his/her understanding and acceptance.   Dental advisory given  Plan Discussed with: CRNA and Surgeon  Anesthesia Plan Comments: (Plan routine monitors, GETA)        Anesthesia Quick Evaluation

## 2015-04-15 NOTE — Anesthesia Procedure Notes (Signed)
Procedure Name: Intubation Date/Time: 04/15/2015 9:19 AM Performed by: Reine JustFLOWERS, Boe Deans T Pre-anesthesia Checklist: Patient identified, Emergency Drugs available, Suction available, Patient being monitored and Timeout performed Patient Re-evaluated:Patient Re-evaluated prior to inductionOxygen Delivery Method: Circle system utilized and Simple face mask Preoxygenation: Pre-oxygenation with 100% oxygen Intubation Type: IV induction Ventilation: Mask ventilation without difficulty Laryngoscope Size: Mac and 4 Grade View: Grade I Tube type: Subglottic suction tube Tube size: 7.5 mm Number of attempts: 1 Airway Equipment and Method: Patient positioned with wedge pillow and Stylet Placement Confirmation: ETT inserted through vocal cords under direct vision,  positive ETCO2 and breath sounds checked- equal and bilateral Secured at: 22 cm Tube secured with: Tape Dental Injury: Teeth and Oropharynx as per pre-operative assessment

## 2015-04-15 NOTE — H&P (Signed)
Chief Complaint: Patient was seen in consultation today for Low back pain with a vascular abnormality of the lumbosacral region   Referring Physician(s): Dr. Dwana MelenaZack Hall  History of Present Illness: Jeremiah Murphy is a 74 y.o. male who has had low back pain since an injury in [place] in 1968. Since that time the patient reports having had low back pain.   Recently he has had worsening pain in the lower lumbar region especially for the past four months.   He describes pain in the lower back region. He states it can be severe and rates it an 8 out of 10 in terms of intensity.  He states it is sometimes relieved with changing his positioning and while lying down.   The pain apparently is relieved to some degree by the patient standing up.   The pain does radiate into the lower extremities. This is worse on the right  He also reports bilateral lower extremity numbness and tingling which radiates to the soles of his feet.   His pain worsens with prolonged standing or coughing or straining.  He denies recent illness and feels well today.  He denies fever or chills.  He is NPO  Past Medical History  Diagnosis Date  . HTN (hypertension)   . Dyslipidemia   . Normocytic anemia   . Irregular heart rate     "years ago"  . Pneumonia     "walking" pneumonia  . Vertigo   . Arthritis     Past Surgical History  Procedure Laterality Date  . Carpal tunnel release  1970    left  . Cardiac catheterization  1999    "balloon" procedure  . Eye surgery Bilateral     cataract surgery   . Tonsillectomy      Allergies: Review of patient's allergies indicates no known allergies.  Medications: Prior to Admission medications   Medication Sig Start Date End Date Taking? Authorizing Provider  acetaminophen (TYLENOL) 500 MG tablet Take 500 mg by mouth at bedtime.    Historical Provider, MD  atenolol (TENORMIN) 25 MG tablet Take 25 mg by mouth daily.  05/12/13   Historical Provider,  MD  Iron-FA-B Cmp-C-Biot-Probiotic (FUSION PLUS PO) Take 1 tablet by mouth every morning.    Historical Provider, MD  simvastatin (ZOCOR) 20 MG tablet Take 20 mg by mouth daily at 6 PM.  06/12/13   Historical Provider, MD     Family History  Problem Relation Age of Onset  . Breast cancer Mother   . Breast cancer Sister   . Breast cancer Sister   . Colon cancer Neg Hx     Social History   Social History  . Marital Status: Married    Spouse Name: N/A  . Number of Children: N/A  . Years of Education: N/A   Social History Main Topics  . Smoking status: Never Smoker   . Smokeless tobacco: Never Used  . Alcohol Use: No  . Drug Use: No  . Sexual Activity: Not on file   Other Topics Concern  . Not on file   Social History Narrative     Review of Systems  Constitutional: Positive for activity change. Negative for fever, chills, appetite change and fatigue.  HENT: Negative.   Respiratory: Negative for cough, chest tightness and shortness of breath.   Cardiovascular: Negative for chest pain.  Gastrointestinal: Negative for nausea, vomiting, abdominal pain and abdominal distention.  Genitourinary: Negative.   Musculoskeletal: Positive for back pain. Negative  for gait problem.  Skin: Negative.   Neurological: Positive for numbness.  Psychiatric/Behavioral: Negative.     Vital Signs: BP 147/75 P 98 R 20 Temp 97.3  Physical Exam  Constitutional: He is oriented to person, place, and time. He appears well-developed and well-nourished.  HENT:  Head: Normocephalic and atraumatic.  Eyes: EOM are normal.  Neck: Normal range of motion. Neck supple.  Cardiovascular: Normal rate, regular rhythm and normal heart sounds.   No murmur heard. Pulmonary/Chest: Effort normal and breath sounds normal. No respiratory distress. He has no wheezes.  Abdominal: Soft. Bowel sounds are normal. He exhibits no distension. There is no tenderness.  Musculoskeletal: Normal range of motion.    Neurological: He is alert and oriented to person, place, and time.  Skin: Skin is warm and dry.  Psychiatric: He has a normal mood and affect. His behavior is normal. Judgment and thought content normal.  Vitals reviewed.   Mallampati Score:  MD Evaluation Airway: WNL Heart: WNL Abdomen: WNL Chest/ Lungs: WNL ASA  Classification: 3 Mallampati/Airway Score: One  Imaging:  CLINICAL DATA: 74 year old male with 6 months of lumbar back pain radiating to the right lower extremity with numbness. Polyneuropathy, lumbar radiculopathy. Initial encounter.  EXAM: MRI LUMBAR SPINE WITHOUT CONTRAST  TECHNIQUE: Multiplanar, multisequence MR imaging of the lumbar spine was performed. No intravenous contrast was administered.  COMPARISON: None.  FINDINGS: Lumbar segmentation appears to be normal and will be designated as such for this report.  The visualized lower thoracic spinal cord is abnormal. There is abnormal T2 and STIR hyperintensity throughout the cord, most pronounced from the T12 level to just above the conus at L2-L3. In the right hemi cord at L1 there is a dark T1, T2, and STIR intra medullary lesion measuring 9-10 mm (series 3, image 9 and series 7, image 12. There is further signal dropout on gradient echo images through this lesion (series 12, images 16 and 17). There are prominent vessels coursing in the nearby right T12 and L1 neural foramina, as well as ascending along the right lateral thecal sac best seen on series 3, image 8. These foraminal vessels communicate with right paravertebral vessels as seen on series 7, image 1 at the T12 level.  The cauda equina nerve roots appear relatively normal, they might be slightly thickened. There is no acute osseous abnormality. There is trace degenerative appearing right far lateral marrow edema at L4-L5.  Negative visualized abdominal viscera ; benign appearing medial left renal cyst. Negative visualized  posterior paraspinal soft tissues.  The following superimposed degenerative changes are noted:  T11-T12: Mild disc bulge and posterior element hypertrophy.  T12-L1: Mild disc bulge.  L1-L2: Mild disc bulge.  L2-L3: Left eccentric circumferential disc bulge. Mild facet and ligament flavum hypertrophy. Borderline to mild spinal stenosis.  L3-L4: Mild left eccentric disc bulge and facet hypertrophy. Endplate spurring. Mild to moderate left and mild right L3 foraminal stenosis.  L4-L5: Circumferential disc bulge and mild facet and ligament flavum hypertrophy. No spinal or lateral recess stenosis. Moderate right greater than left L4 foraminal stenosis.  L5-S1: Disc space loss and circumferential disc osteophyte complex. Mild facet hypertrophy. Mild L5 foraminal stenosis.  IMPRESSION: 1. Abnormal signal in the lower thoracic spinal cord especially from T12 to just above the conus at L2-L3. 10 mm focus of hemorrhage within the right hemicord at L1, and associated abnormal dural vessels tracking longitudinally along the right aspect of the thecal sac, and communicating with extra-spinal paravertebral vessels via the right  side neural foramina at T12 and L1. 2. 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 3. Study discussed by telephone with Dr. Nita Sells on 02/19/2015 at 1020 hours. We discussed Neuro-Endovascular follow-up for further evaluation and treatment planning.   Electronically Signed  By: Odessa Fleming M.D.  On: 02/19/2015 10:23   Labs:  CBC:  Recent Labs  07/10/14 0055 04/15/15 0639  WBC 6.7 6.7  HGB 11.7* 13.2  HCT 35.6* 39.9  PLT 215 178    COAGS:  Recent Labs  07/10/14 0055 04/15/15 0639  INR 0.95 1.08  APTT 29 34    BMP:  Recent Labs  07/10/14 0055 04/15/15 0639  NA 139 140  K 3.8 3.7  CL 107 107  CO2 27 24  GLUCOSE 127* 112*  BUN 16 13  CALCIUM 8.8 9.3  CREATININE 1.04 1.21    GFRNONAA 69* 57*  GFRAA 80* >60    LIVER FUNCTION TESTS:  Recent Labs  07/10/14 0055 04/15/15 0639  BILITOT 0.5 0.8  AST 32 27  ALT 16 16*  ALKPHOS 52 50  PROT 7.3 6.8  ALBUMIN 3.8 3.9    TUMOR MARKERS: No results for input(s): AFPTM, CEA, CA199, CHROMGRNA in the last 8760 hours.  Assessment and Plan:  Back pain with radiation and numbness to both lower extremities probably secondary to some form of an abnormal vascular malformation suggestive of a dural AV fistula.  Will proceed with a formal catheter arteriogram of his thoracolumbar region of the spine today by Dr. Corliss Skains.  Depending on the angioarchitecture of the vascular abnormality, further recommendations would be considered. This may entail endovascular treatment versus open surgery.   Risks and Benefits discussed with the patient including, but not limited to bleeding, infection, vascular injury or contrast induced renal failure.  All of the patient's questions were answered, patient is agreeable to proceed. Consent signed and in chart.  Signed: Gwynneth Macleod PA-C 04/15/2015, 7:59 AM   I spent a total of  25 Minutes in face to face in clinical consultation, greater than 50% of which was counseling/coordinating care for arteriogram of thoracolumbar region.

## 2015-04-15 NOTE — Discharge Instructions (Signed)
Angiogram, Care After °Refer to this sheet in the next few weeks. These instructions provide you with information about caring for yourself after your procedure. Your health care provider may also give you more specific instructions. Your treatment has been planned according to current medical practices, but problems sometimes occur. Call your health care provider if you have any problems or questions after your procedure. °WHAT TO EXPECT AFTER THE PROCEDURE °After your procedure, it is typical to have the following: °· Bruising at the catheter insertion site that usually fades within 1-2 weeks. °· Blood collecting in the tissue (hematoma) that may be painful to the touch. It should usually decrease in size and tenderness within 1-2 weeks. °HOME CARE INSTRUCTIONS °· Take medicines only as directed by your health care provider. °· You may shower 24-48 hours after the procedure or as directed by your health care provider. Remove the bandage (dressing) and gently wash the site with plain soap and water. Pat the area dry with a clean towel. Do not rub the site, because this may cause bleeding. °· Do not take baths, swim, or use a hot tub until your health care provider approves. °· Check your insertion site every day for redness, swelling, or drainage. °· Do not apply powder or lotion to the site. °· Do not lift over 10 lb (4.5 kg) for 5 days after your procedure or as directed by your health care provider. °· Ask your health care provider when it is okay to: °¨ Return to work or school. °¨ Resume usual physical activities or sports. °¨ Resume sexual activity. °· Do not drive home if you are discharged the same day as the procedure. Have someone else drive you. °· You may drive 24 hours after the procedure unless otherwise instructed by your health care provider. °· Do not operate machinery or power tools for 24 hours after the procedure or as directed by your health care provider. °· If your procedure was done as an  outpatient procedure, which means that you went home the same day as your procedure, a responsible adult should be with you for the first 24 hours after you arrive home. °· Keep all follow-up visits as directed by your health care provider. This is important. °SEEK MEDICAL CARE IF: °· You have a fever. °· You have chills. °· You have increased bleeding from the catheter insertion site. Hold pressure on the site. °SEEK IMMEDIATE MEDICAL CARE IF: °· You have unusual pain at the catheter insertion site. °· You have redness, warmth, or swelling at the catheter insertion site. °· You have drainage (other than a small amount of blood on the dressing) from the catheter insertion site. °· The catheter insertion site is bleeding, and the bleeding does not stop after 30 minutes of holding steady pressure on the site. °· The area near or just beyond the catheter insertion site becomes pale, cool, tingly, or numb. °  °This information is not intended to replace advice given to you by your health care provider. Make sure you discuss any questions you have with your health care provider. °  °Document Released: 10/27/2004 Document Revised: 05/01/2014 Document Reviewed: 09/11/2012 °Elsevier Interactive Patient Education ©2016 [employer] ° °

## 2015-04-15 NOTE — Procedures (Signed)
S/P complete spinal arteriogram and cerebral arteriogram, RT CFA approach. Findings. 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

## 2015-04-16 ENCOUNTER — Encounter (HOSPITAL_COMMUNITY): Payer: Self-pay | Admitting: Interventional Radiology

## 2015-05-04 ENCOUNTER — Other Ambulatory Visit (HOSPITAL_COMMUNITY): Payer: Self-pay | Admitting: Interventional Radiology

## 2015-05-04 DIAGNOSIS — M545 Low back pain, unspecified: Secondary | ICD-10-CM

## 2015-05-04 DIAGNOSIS — G8929 Other chronic pain: Secondary | ICD-10-CM

## 2015-05-04 DIAGNOSIS — I770 Arteriovenous fistula, acquired: Secondary | ICD-10-CM

## 2015-05-10 ENCOUNTER — Other Ambulatory Visit: Payer: Self-pay | Admitting: Radiology

## 2015-05-14 ENCOUNTER — Encounter (HOSPITAL_COMMUNITY): Payer: Self-pay

## 2015-05-14 ENCOUNTER — Encounter (HOSPITAL_COMMUNITY)
Admission: RE | Admit: 2015-05-14 | Discharge: 2015-05-14 | Disposition: A | Discharge status: Home / Self Care | Payer: Medicare HMO | Source: Ambulatory Visit | Attending: Interventional Radiology | Admitting: Interventional Radiology

## 2015-05-14 LAB — CBC WITH DIFFERENTIAL/PLATELET
BASOS PCT: 1 %
Basophils Absolute: 0 10*3/uL (ref 0.0–0.1)
EOS ABS: 0.2 10*3/uL (ref 0.0–0.7)
EOS PCT: 3 %
HCT: 40.9 % (ref 39.0–52.0)
HEMOGLOBIN: 14.1 g/dL (ref 13.0–17.0)
Lymphocytes Relative: 16 %
Lymphs Abs: 0.9 10*3/uL (ref 0.7–4.0)
MCH: 32.6 pg (ref 26.0–34.0)
MCHC: 34.5 g/dL (ref 30.0–36.0)
MCV: 94.7 fL (ref 78.0–100.0)
Monocytes Absolute: 0.3 10*3/uL (ref 0.1–1.0)
Monocytes Relative: 5 %
NEUTROS PCT: 75 %
Neutro Abs: 4.3 10*3/uL (ref 1.7–7.7)
PLATELETS: 170 10*3/uL (ref 150–400)
RBC: 4.32 MIL/uL (ref 4.22–5.81)
RDW: 13.8 % (ref 11.5–15.5)
WBC: 5.7 10*3/uL (ref 4.0–10.5)

## 2015-05-14 LAB — COMPREHENSIVE METABOLIC PANEL
ALBUMIN: 3.8 g/dL (ref 3.5–5.0)
ALT: 20 U/L (ref 17–63)
AST: 32 U/L (ref 15–41)
Alkaline Phosphatase: 58 U/L (ref 38–126)
Anion gap: 10 (ref 5–15)
BUN: 9 mg/dL (ref 6–20)
CHLORIDE: 108 mmol/L (ref 101–111)
CO2: 27 mmol/L (ref 22–32)
CREATININE: 1.09 mg/dL (ref 0.61–1.24)
Calcium: 9 mg/dL (ref 8.9–10.3)
Glucose, Bld: 108 mg/dL — ABNORMAL HIGH (ref 65–99)
POTASSIUM: 3.8 mmol/L (ref 3.5–5.1)
SODIUM: 145 mmol/L (ref 135–145)
Total Bilirubin: 0.6 mg/dL (ref 0.3–1.2)
Total Protein: 6.7 g/dL (ref 6.5–8.1)

## 2015-05-14 LAB — APTT: aPTT: 35 seconds (ref 24–37)

## 2015-05-14 LAB — PROTIME-INR
INR: 1.05 (ref 0.00–1.49)
Prothrombin Time: 13.9 seconds (ref 11.6–15.2)

## 2015-05-14 NOTE — Progress Notes (Signed)
From last procedure to today, nothing has changed in his health history.

## 2015-05-14 NOTE — Pre-Procedure Instructions (Signed)
Azar South Reisz  05/14/2015      [facility] - [place], [place] - [address] [place] [address] Phone: [phone] Fax: [phone]    Your procedure is scheduled on January 23rd, Monday   Report to [facility] Admitting at 6:00 AM             (Procedure time 8:00 to 12:00)  Call this number if you have problems the morning of surgery:  [phone]   Remember:  Do not eat food or drink liquids after midnight Sunday .   Take these medicines the morning of surgery with A SIP OF WATER : atentolol   Do not wear jewelry - no rings or watches.  Do not wear lotions or colognes.   You may NOT  wear deodorant the day of the procedure.             Men may shave face and neck.   Do not bring valuables to the hospital.  [facility] is not responsible for any belongings or valuables.  Contacts, dentures or bridgework may not be worn into surgery.  Leave your suitcase in the car.  After surgery it may be brought to your room. For patients admitted to the hospital, discharge time will be determined by your treatment team.  Name and phone number of your driver:     Please read over the following fact sheets that you were given. Pain Booklet and Surgical Site Infection Prevention

## 2015-05-14 NOTE — Progress Notes (Signed)
Anesthesia Chart Review: See my note from 04/14/15. Since then he underwent complete spinal arteriogram and cerebral arteriogram via right CFA approach under [place]. 04/15/15 findings showed:  IMPRESSION: Abnormal prominence of the thoracic and intercostal branches at the left T9, the left T10, the left and right T11, the right T12, bilateral L1 lumbar arteries, bilateral lumbar L2 arteries, the left lumbar 3 artery, and the right L4 lumbar artery leading to at least two prominent venous channels projecting cranially, one centrally, one in the right paramedian area. Findings suggestive of spinal dural AV fistula with numerous arterial feeders as described above.  He is now scheduled for possible embolization of spinal AVF by Dr. Isidoro Donning on Monday. PAT was 05/14/15. He denied any new changes over the past month. See my last note regarding his history and possible CAD history from his time living in [place].  Meds include ASA , atenolol, Fusion Plus, Zocor.   Preoperative labs noted.   Chart reviewed with an anesthesiologist prior to his last IR procedure which he had on 04/15/15. He returns one month later for another IR procedure. He denied any significant changes, so if nothing unexpected then I would anticipate that he could proceed as planned.  Velna Ochs [facility] Short Stay Center/Anesthesiology Phone [phone] 05/14/2015 3:46 PM

## 2015-05-17 ENCOUNTER — Ambulatory Visit (HOSPITAL_COMMUNITY)
Admission: RE | Admit: 2015-05-17 | Discharge: 2015-05-17 | Disposition: A | Discharge status: Home / Self Care | Payer: Medicare HMO | Source: Ambulatory Visit | Attending: Interventional Radiology | Admitting: Interventional Radiology

## 2015-05-17 ENCOUNTER — Ambulatory Visit (HOSPITAL_COMMUNITY): Payer: Medicare HMO | Admitting: Anesthesiology

## 2015-05-17 ENCOUNTER — Ambulatory Visit (HOSPITAL_COMMUNITY): Payer: Medicare HMO | Admitting: Vascular Surgery

## 2015-05-17 ENCOUNTER — Encounter (HOSPITAL_COMMUNITY)
Admission: AD | Disposition: A | Discharge status: Home / Self Care | Payer: Self-pay | Source: Ambulatory Visit | Attending: Interventional Radiology

## 2015-05-17 ENCOUNTER — Encounter (HOSPITAL_COMMUNITY): Payer: Self-pay

## 2015-05-17 ENCOUNTER — Inpatient Hospital Stay (HOSPITAL_COMMUNITY)
Admission: AD | Admit: 2015-05-17 | Discharge: 2015-05-18 | DRG: 254 | Disposition: A | Discharge status: Home / Self Care | Payer: Medicare HMO | Source: Ambulatory Visit | Attending: Interventional Radiology | Admitting: Interventional Radiology

## 2015-05-17 DIAGNOSIS — M1990 Unspecified osteoarthritis, unspecified site: Secondary | ICD-10-CM | POA: Diagnosis present

## 2015-05-17 DIAGNOSIS — Q288 Other specified congenital malformations of circulatory system: Secondary | ICD-10-CM | POA: Diagnosis not present

## 2015-05-17 DIAGNOSIS — Z79899 Other long term (current) drug therapy: Secondary | ICD-10-CM | POA: Diagnosis not present

## 2015-05-17 DIAGNOSIS — Q279 Congenital malformation of peripheral vascular system, unspecified: Secondary | ICD-10-CM | POA: Diagnosis not present

## 2015-05-17 DIAGNOSIS — E785 Hyperlipidemia, unspecified: Secondary | ICD-10-CM | POA: Diagnosis present

## 2015-05-17 DIAGNOSIS — I10 Essential (primary) hypertension: Secondary | ICD-10-CM | POA: Diagnosis present

## 2015-05-17 DIAGNOSIS — D649 Anemia, unspecified: Secondary | ICD-10-CM | POA: Diagnosis not present

## 2015-05-17 DIAGNOSIS — G8929 Other chronic pain: Secondary | ICD-10-CM

## 2015-05-17 DIAGNOSIS — Z9842 Cataract extraction status, left eye: Secondary | ICD-10-CM

## 2015-05-17 DIAGNOSIS — Z9841 Cataract extraction status, right eye: Secondary | ICD-10-CM | POA: Diagnosis not present

## 2015-05-17 DIAGNOSIS — M545 Low back pain: Secondary | ICD-10-CM

## 2015-05-17 DIAGNOSIS — Z7982 Long term (current) use of aspirin: Secondary | ICD-10-CM | POA: Diagnosis not present

## 2015-05-17 DIAGNOSIS — Q2739 Arteriovenous malformation, other site: Secondary | ICD-10-CM | POA: Diagnosis not present

## 2015-05-17 DIAGNOSIS — I770 Arteriovenous fistula, acquired: Secondary | ICD-10-CM

## 2015-05-17 HISTORY — PX: RADIOLOGY WITH ANESTHESIA: SHX6223

## 2015-05-17 LAB — GLUCOSE, CAPILLARY: Glucose-Capillary: 111 mg/dL — ABNORMAL HIGH (ref 65–99)

## 2015-05-17 LAB — POCT ACTIVATED CLOTTING TIME: ACTIVATED CLOTTING TIME: 219 s

## 2015-05-17 SURGERY — RADIOLOGY WITH ANESTHESIA
Anesthesia: General

## 2015-05-17 MED ORDER — ONDANSETRON HCL 4 MG/2ML IJ SOLN: 4.0000 mg | Freq: Four times a day (QID) | INTRAMUSCULAR | Status: DC | PRN

## 2015-05-17 MED ORDER — NITROGLYCERIN 1 MG/10 ML FOR IR/CATH LAB
INTRA_ARTERIAL | Status: AC
  Filled 2015-05-17: qty 10

## 2015-05-17 MED ORDER — ASPIRIN 81 MG PO CHEW
CHEWABLE_TABLET | ORAL | Status: AC
  Filled 2015-05-17: qty 4

## 2015-05-17 MED ORDER — SODIUM CHLORIDE 0.9 % IV SOLN: Freq: Once | INTRAVENOUS | Status: DC

## 2015-05-17 MED ORDER — FAMOTIDINE IN NACL 20-0.9 MG/50ML-% IV SOLN
20.0000 mg | Freq: Once | INTRAVENOUS | Status: AC
  Administered 2015-05-17: 20 mg via INTRAVENOUS

## 2015-05-17 MED ORDER — CEFAZOLIN SODIUM-DEXTROSE 2-3 GM-% IV SOLR: 2.0000 g | INTRAVENOUS | Status: DC

## 2015-05-17 MED ORDER — NICARDIPINE HCL IN NACL 20-0.86 MG/200ML-% IV SOLN
5.0000 mg/h | INTRAVENOUS | Status: DC
  Administered 2015-05-17: 5 mg/h via INTRAVENOUS
  Filled 2015-05-17 (×2): qty 200

## 2015-05-17 MED ORDER — EPHEDRINE SULFATE 50 MG/ML IJ SOLN
INTRAMUSCULAR | Status: DC | PRN
  Administered 2015-05-17: 5 mg via INTRAVENOUS

## 2015-05-17 MED ORDER — ACETAMINOPHEN 500 MG PO TABS: 1000.0000 mg | ORAL_TABLET | Freq: Four times a day (QID) | ORAL | Status: DC | PRN

## 2015-05-17 MED ORDER — ASPIRIN 81 MG PO CHEW
324.0000 mg | CHEWABLE_TABLET | Freq: Once | ORAL | Status: AC
  Administered 2015-05-17: 324 mg via ORAL
  Filled 2015-05-17: qty 4

## 2015-05-17 MED ORDER — ATENOLOL 25 MG PO TABS: 25.0000 mg | ORAL_TABLET | Freq: Every day | ORAL | Status: DC

## 2015-05-17 MED ORDER — SUGAMMADEX SODIUM 200 MG/2ML IV SOLN
INTRAVENOUS | Status: DC | PRN
  Administered 2015-05-17: 180 mg via INTRAVENOUS

## 2015-05-17 MED ORDER — PHENYLEPHRINE HCL 10 MG/ML IJ SOLN
10.0000 mg | INTRAVENOUS | Status: DC | PRN
  Administered 2015-05-17: 20 ug/min via INTRAVENOUS

## 2015-05-17 MED ORDER — IOHEXOL 300 MG/ML  SOLN
450.0000 mL | Freq: Once | INTRAMUSCULAR | Status: AC | PRN
  Administered 2015-05-17: 180 mL via INTRA_ARTERIAL

## 2015-05-17 MED ORDER — HEPARIN SODIUM (PORCINE) 1000 UNIT/ML IJ SOLN
INTRAMUSCULAR | Status: AC
  Filled 2015-05-17: qty 1

## 2015-05-17 MED ORDER — FENTANYL CITRATE (PF) 100 MCG/2ML IJ SOLN: 25.0000 ug | INTRAMUSCULAR | Status: DC | PRN

## 2015-05-17 MED ORDER — FENTANYL CITRATE (PF) 100 MCG/2ML IJ SOLN
INTRAMUSCULAR | Status: DC | PRN
  Administered 2015-05-17: 100 ug via INTRAVENOUS

## 2015-05-17 MED ORDER — LIDOCAINE HCL (CARDIAC) 20 MG/ML IV SOLN
INTRAVENOUS | Status: DC | PRN
  Administered 2015-05-17: 80 mg via INTRAVENOUS

## 2015-05-17 MED ORDER — ACETAMINOPHEN 650 MG RE SUPP: 650.0000 mg | Freq: Four times a day (QID) | RECTAL | Status: DC | PRN

## 2015-05-17 MED ORDER — SIMVASTATIN 20 MG PO TABS
20.0000 mg | ORAL_TABLET | Freq: Every day | ORAL | Status: DC
  Administered 2015-05-17: 20 mg via ORAL
  Filled 2015-05-17: qty 1

## 2015-05-17 MED ORDER — PROPOFOL 10 MG/ML IV BOLUS
INTRAVENOUS | Status: DC | PRN
Start: 2015-05-17 — End: 2015-05-17
  Administered 2015-05-17: 80 mg via INTRAVENOUS
  Administered 2015-05-17: 70 mg via INTRAVENOUS
  Administered 2015-05-17: 50 mg via INTRAVENOUS

## 2015-05-17 MED ORDER — LIDOCAINE HCL 1 % IJ SOLN
INTRAMUSCULAR | Status: AC
  Filled 2015-05-17: qty 20

## 2015-05-17 MED ORDER — ROCURONIUM BROMIDE 100 MG/10ML IV SOLN
INTRAVENOUS | Status: DC | PRN
  Administered 2015-05-17 (×4): 5 mg via INTRAVENOUS
  Administered 2015-05-17: 10 mg via INTRAVENOUS
  Administered 2015-05-17: 50 mg via INTRAVENOUS
  Administered 2015-05-17 (×2): 10 mg via INTRAVENOUS

## 2015-05-17 MED ORDER — DEXAMETHASONE SODIUM PHOSPHATE 10 MG/ML IJ SOLN
10.0000 mg | Freq: Once | INTRAMUSCULAR | Status: AC
  Administered 2015-05-17: 10 mg via INTRAVENOUS
  Filled 2015-05-17: qty 1

## 2015-05-17 MED ORDER — HEPARIN SODIUM (PORCINE) 1000 UNIT/ML IJ SOLN
INTRAMUSCULAR | Status: DC | PRN
  Administered 2015-05-17 (×3): 500 [IU] via INTRAVENOUS
  Administered 2015-05-17: 1000 [IU] via INTRAVENOUS
  Administered 2015-05-17: 3000 [IU] via INTRAVENOUS

## 2015-05-17 MED ORDER — ACETAMINOPHEN 500 MG PO TABS
500.0000 mg | ORAL_TABLET | Freq: Every day | ORAL | Status: DC
  Administered 2015-05-17: 500 mg via ORAL
  Filled 2015-05-17: qty 1

## 2015-05-17 MED ORDER — NITROGLYCERIN 0.2 MG/ML ON CALL CATH LAB
INTRAVENOUS | Status: DC | PRN
  Administered 2015-05-17 (×2): 40 ug via INTRAVENOUS

## 2015-05-17 MED ORDER — ONDANSETRON HCL 4 MG/2ML IJ SOLN: 4.0000 mg | Freq: Once | INTRAMUSCULAR | Status: DC | PRN

## 2015-05-17 MED ORDER — LACTATED RINGERS IV SOLN
INTRAVENOUS | Status: DC | PRN
  Administered 2015-05-17 (×3): via INTRAVENOUS

## 2015-05-17 MED ORDER — CEFAZOLIN SODIUM-DEXTROSE 2-3 GM-% IV SOLR
INTRAVENOUS | Status: AC
  Administered 2015-05-17: 2 g via INTRAVENOUS
  Filled 2015-05-17: qty 50

## 2015-05-17 MED ORDER — SODIUM CHLORIDE 0.9 % IV SOLN
INTRAVENOUS | Status: DC
  Administered 2015-05-17 – 2015-05-18 (×2): via INTRAVENOUS

## 2015-05-17 MED ORDER — ASPIRIN EC 81 MG PO TBEC
81.0000 mg | DELAYED_RELEASE_TABLET | Freq: Every day | ORAL | Status: DC
  Administered 2015-05-17: 81 mg via ORAL
  Filled 2015-05-17: qty 1

## 2015-05-17 MED ORDER — FAMOTIDINE IN NACL 20-0.9 MG/50ML-% IV SOLN
INTRAVENOUS | Status: AC
  Administered 2015-05-17: 20 mg via INTRAVENOUS
  Filled 2015-05-17: qty 50

## 2015-05-17 MED ORDER — LACTATED RINGERS IV SOLN
INTRAVENOUS | Status: DC
  Administered 2015-05-17 (×2): via INTRAVENOUS

## 2015-05-17 MED ORDER — GLYCOPYRROLATE 0.2 MG/ML IJ SOLN
INTRAMUSCULAR | Status: DC | PRN
  Administered 2015-05-17: 0.1 mg via INTRAVENOUS
  Administered 2015-05-17: 0.2 mg via INTRAVENOUS

## 2015-05-17 MED ORDER — LACTATED RINGERS IV SOLN: INTRAVENOUS | Status: DC

## 2015-05-17 MED ORDER — PROTAMINE SULFATE 10 MG/ML IV SOLN
INTRAVENOUS | Status: DC | PRN
  Administered 2015-05-17: 5 mg via INTRAVENOUS

## 2015-05-17 NOTE — Procedures (Signed)
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

## 2015-05-17 NOTE — Progress Notes (Signed)
Day of Surgery  Subjective: Post staged embolization of complex thoraco lumbarspinal vascular malformation.. Fully recovered from anesthesia .denie sany new neurological complaints,except for bilateral LE paresthesias ,improved. Denies any weakness ,or HAs, or N/V ,or visual or speech difficlties. Says is hungry  Objective: Vital signs in last 24 hours: Temp:  [97 F (36.1 C)-97.7 F (36.5 C)] 97.7 F (36.5 C) (01/23 1455) Pulse Rate:  [76-83] 80 (01/23 1446) Resp:  [13-16] 15 (01/23 1446) BP: (125-157)/(67-74) 127/67 mmHg (01/23 1446) SpO2:  [97 %-100 %] 100 % (01/23 1446) Arterial Line BP: (145-153)/(62-66) 153/63 mmHg (01/23 1446) Weight:  [166 lb 3.2 oz (75.388 kg)] 166 lb 3.2 oz (75.388 kg) (01/23 0647)    Intake/Output from previous day:   Intake/Output this shift: Total I/O In: -  Out: 3400 [Urine:2600; Blood:800]  On exam. VS BP 120s/70s.HR 70s to 80s SR PAO2 > 95 % RA.  Alert, awake oriented to time ,place and space.Marland Kitchen  Speech and comprehension clear.Marland Kitchen  PEARLA..3mm Rt =Lt  EOMS full.Jill Alexanders Fields.Full to confrontation  No Facial asymmetry.  Tongue Midline..  Motor..           NO drift of outstretched arms..          Power.5/5 Proximally and distally all four extremities..  Fine motor and coordination to finger to nose equal... Great toe propiocetion RT = LT  Gait  Not tested Romberg. Not tested  Heel to toe.not tested.  Rt groin soft No palpable hematoma Pulses @+  Lab Results:  No results for input(s): WBC, HGB, HCT, PLT in the last 72 hours. BMET No results for input(s): NA, K, CL, CO2, GLUCOSE, BUN, CREATININE, CALCIUM in the last 72 hours. PT/INR No results for input(s): LABPROT, INR in the last 72 hours. ABG No results for input(s): PHART, HCO3 in the last 72 hours.  Invalid input(s): PCO2, PO2  Studies/Results: No results found.  Anti-infectives: Anti-infectives    None      Assessment/Plan: s/p Staged embolization of  Spinal vascular malformation..  .  Plan . Close neuro ob sand BP control. Advance diet as tolerated. D/C fopley at 9 pm.. D/W patient and wife. Oneal Grout 05/17/2015

## 2015-05-17 NOTE — Anesthesia Postprocedure Evaluation (Signed)
Anesthesia Post Note  Patient: Jeremiah Murphy  Procedure(s) Performed: Procedure(s) (LRB): RADIOLOGY WITH ANESTHESIA (N/A)  Patient location during evaluation: PACU Anesthesia Type: General Level of consciousness: awake and alert Pain management: pain level controlled Vital Signs Assessment: post-procedure vital signs reviewed and stable Respiratory status: spontaneous breathing, nonlabored ventilation, respiratory function stable and patient connected to nasal cannula oxygen Cardiovascular status: blood pressure returned to baseline and stable Postop Assessment: no signs of nausea or vomiting Anesthetic complications: no    Last Vitals:  Filed Vitals:   05/17/15 1900 05/17/15 1915  BP: 109/63 121/62  Pulse: 88 86  Temp:    Resp: 15 21    Last Pain: There were no vitals filed for this visit.               Reino Kent

## 2015-05-17 NOTE — Anesthesia Procedure Notes (Signed)
Procedure Name: Intubation Date/Time: 05/17/2015 9:22 AM Performed by: Izora Gala Pre-anesthesia Checklist: Patient identified, Emergency Drugs available, Suction available and Patient being monitored Patient Re-evaluated:Patient Re-evaluated prior to inductionOxygen Delivery Method: Circle system utilized Preoxygenation: Pre-oxygenation with 100% oxygen Intubation Type: IV induction Ventilation: Mask ventilation without difficulty Laryngoscope Size: Miller and 3 Grade View: Grade I Tube type: Oral Tube size: 7.5 mm Number of attempts: 1 Airway Equipment and Method: Stylet and LTA kit utilized Placement Confirmation: ETT inserted through vocal cords under direct vision,  positive ETCO2 and breath sounds checked- equal and bilateral Secured at: 23 cm Tube secured with: Tape Dental Injury: Teeth and Oropharynx as per pre-operative assessment

## 2015-05-17 NOTE — H&P (Signed)
Chief Complaint: Patient was seen in consultation today for spinal arteriogram with possible embolization of dural arteriovenous fistula at the request of Dr Dwana Melena  Referring Physician(s): Dr Dwana Melena  History of Present Illness: Jeremiah Murphy is a 75 y.o. male    Pt has had back pain chronically for years. Worsening over last 3-4 months MRI: 01/2015 IMPRESSION: 1. Abnormal signal in the lower thoracic spinal cord especially from T12 to just above the conus at L2-L3. 10 mm focus of hemorrhage within the right hemicord at L1, and associated abnormal dural vessels tracking longitudinally along the right aspect of the thecal sac, and communicating with extra-spinal paravertebral vessels via the right side neural foramina at T12 and L1. 2. 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  Referred to Dr Corliss Skains for consultation/evaluation 03/2015 and planned arteriogram 04/28/2015 spinal arteriogram: IMPRESSION: Abnormal prominence of the thoracic and intercostal branches at the left T9, the left T10, the left and right T11, the right T12, bilateral L1 lumbar arteries, bilateral lumbar L2 arteries, the left lumbar 3 artery, and the right L4 lumbar artery leading to at least two prominent venous channels projecting cranially, one centrally, one in the right paramedian area. Findings suggestive of spinal dural AV fistula with numerous arterial feeders as described above.  Now scheduled for spinal arteriogram with possible embolization of Dural ArterioVenous fistula   Past Medical History  Diagnosis Date  . HTN (hypertension)   . Dyslipidemia   . Normocytic anemia   . Irregular heart rate     "years ago"  . Pneumonia     "walking" pneumonia  . Vertigo   . Arthritis     Past Surgical History  Procedure Laterality Date  . Carpal tunnel release  1970    left  . Cardiac catheterization  1999    "balloon" procedure  .  Eye surgery Bilateral     cataract surgery   . Tonsillectomy    . Radiology with anesthesia N/A 04/15/2015    Procedure: SPINAL ANGIOGRAM WITH TREATMENT    (RADIOLOGY WITH ANESTHESIA);  Surgeon: Julieanne Cotton, MD;  Location: [facility] OR;  Service: Radiology;  Laterality: N/A;    Allergies: Review of patient's allergies indicates no known allergies.  Medications: Prior to Admission medications   Medication Sig Start Date End Date Taking? Authorizing Provider  acetaminophen (TYLENOL) 500 MG tablet Take 500 mg by mouth at bedtime.   Yes Historical Provider, MD  aspirin EC 81 MG tablet Take 81 mg by mouth daily.   Yes Historical Provider, MD  atenolol (TENORMIN) 25 MG tablet Take 25 mg by mouth daily.  05/12/13  Yes Historical Provider, MD  Iron-FA-B Cmp-C-Biot-Probiotic (FUSION PLUS PO) Take 1 tablet by mouth every morning.   Yes Historical Provider, MD  simvastatin (ZOCOR) 20 MG tablet Take 20 mg by mouth daily at 6 PM.  06/12/13  Yes Historical Provider, MD     Family History  Problem Relation Age of Onset  . Breast cancer Mother   . Breast cancer Sister   . Breast cancer Sister   . Colon cancer Neg Hx     Social History   Social History  . Marital Status: Married    Spouse Name: N/A  . Number of Children: N/A  . Years of Education: N/A   Social History Main Topics  . Smoking status: Never Smoker   . Smokeless tobacco: Never Used  . Alcohol Use: No  .  Drug Use: No  . Sexual Activity: Not Asked   Other Topics Concern  . None   Social History Narrative     Review of Systems: A 12 point ROS discussed and pertinent positives are indicated in the HPI above.  All other systems are negative.  Review of Systems  Constitutional: Negative for fever, activity change, appetite change and fatigue.  Eyes: Negative for visual disturbance.  Respiratory: Negative for shortness of breath.   Gastrointestinal: Negative for abdominal pain.  Musculoskeletal: Positive for back pain and  gait problem.  Neurological: Positive for weakness.  Psychiatric/Behavioral: Negative for behavioral problems and confusion.    Vital Signs: There were no vitals taken for this visit.  Physical Exam  Constitutional: He is oriented to person, place, and time. He appears well-nourished.  Eyes: EOM are normal.  Neck: Normal range of motion.  Cardiovascular: Normal rate, regular rhythm and normal heart sounds.   No murmur heard. Pulmonary/Chest: Effort normal and breath sounds normal. He has no wheezes.  Abdominal: Soft. Bowel sounds are normal. There is no tenderness.  Musculoskeletal: Normal range of motion.  Neurological: He is alert and oriented to person, place, and time.  Skin: Skin is warm and dry.  Psychiatric: He has a normal mood and affect. His behavior is normal. Judgment and thought content normal.  Nursing note and vitals reviewed.   Mallampati Score:  MD Evaluation Airway: WNL Heart: WNL Abdomen: WNL Chest/ Lungs: WNL ASA  Classification: 2 Mallampati/Airway Score: Two  Imaging: No results found.  Labs:  CBC:  Recent Labs  07/10/14 0055 04/15/15 0639 05/14/15 1358  WBC 6.7 6.7 5.7  HGB 11.7* 13.2 14.1  HCT 35.6* 39.9 40.9  PLT 215 178 170    COAGS:  Recent Labs  07/10/14 0055 04/15/15 0639 05/14/15 1358  INR 0.95 1.08 1.05  APTT 29 34 35    BMP:  Recent Labs  07/10/14 0055 04/15/15 0639 05/14/15 1358  NA 139 140 145  K 3.8 3.7 3.8  CL 107 107 108  CO2 GLUCOSE 127* 112* 108*  BUN CALCIUM 8.8 9.3 9.0  CREATININE 1.04 1.21 1.09  GFRNONAA 69* 57* >60  GFRAA 80* >60 >60    LIVER FUNCTION TESTS:  Recent Labs  07/10/14 0055 04/15/15 0639 05/14/15 1358  BILITOT 0.5 0.8 0.6  AST 32 27 32  ALT 16 16* 20  ALKPHOS 52 50 58  PROT 7.3 6.8 6.7  ALBUMIN 3.8 3.9 3.8    TUMOR MARKERS: No results for input(s): AFPTM, CEA, CA199, CHROMGRNA in the last 8760 hours.  Assessment and Plan:  Spinal ArterioVenous  fistula noted on MRI and confirmed with arteriogram Now scheduled for spinal arteriogram with possible embolization of dural AV fistula Risks and Benefits discussed with the patient including, but not limited to bleeding, infection, vascular injury, contrast induced renal failure, stroke or even death. All of the patient's questions were answered, patient is agreeable to proceed. Consent signed and in chart.  Pt and family aware if intervention is performed pt will be admitted overnight into Neuro ICU for observation with plan to be discharged in am Aware and agreeable to plan  Thank you for this interesting consult.  I greatly enjoyed meeting GERRIT RAFALSKI and look forward to participating in their care.  A copy of this report was sent to the requesting provider on this date.  Electronically Signed: Ralene Muskrat A 05/17/2015, 7:33 AM   I spent a total of  30 Minutes   in face to face in clinical consultation, greater than 50% of which was counseling/coordinating care for spinal dural AV fistula embolization

## 2015-05-17 NOTE — Anesthesia Preprocedure Evaluation (Addendum)
Anesthesia Evaluation  Patient identified by MRN, date of birth, ID band Patient awake    Reviewed: Allergy & Precautions, NPO status , Patient's Chart, lab work & pertinent test results, reviewed documented beta blocker date and time   History of Anesthesia Complications Negative for: history of anesthetic complications  Airway Mallampati: II  TM Distance: >3 FB Neck ROM: Full  Mouth opening: Limited Mouth Opening  Dental  (+) Dental Advisory Given   Pulmonary neg pulmonary ROS,    breath sounds clear to auscultation       Cardiovascular hypertension, Pt. on medications and Pt. on home beta blockers (-) angina+ Peripheral Vascular Disease (spinal AVM)   Rhythm:Regular Rate:Normal     Neuro/Psych R leg pain    GI/Hepatic negative GI ROS, Neg liver ROS,   Endo/Other  negative endocrine ROS  Renal/GU negative Renal ROS     Musculoskeletal  (+) Arthritis , Osteoarthritis,    Abdominal   Peds  Hematology negative hematology ROS (+)   Anesthesia Other Findings   Reproductive/Obstetrics                            Anesthesia Physical Anesthesia Plan  ASA: II  Anesthesia Plan: General   Post-op Pain Management:    Induction: Intravenous  Airway Management Planned: Oral ETT and Video Laryngoscope Planned  Additional Equipment:   Intra-op Plan:   Post-operative Plan: Extubation in OR  Informed Consent: I have reviewed the patients History and Physical, chart, labs and discussed the procedure including the risks, benefits and alternatives for the proposed anesthesia with the patient or authorized representative who has indicated his/her understanding and acceptance.     Plan Discussed with: CRNA and Surgeon  Anesthesia Plan Comments:        Anesthesia Quick Evaluation

## 2015-05-17 NOTE — Transfer of Care (Signed)
Immediate Anesthesia Transfer of Care Note  Patient: Jeremiah Murphy  Procedure(s) Performed: Procedure(s): RADIOLOGY WITH ANESTHESIA (N/A)  Patient Location: PACU  Anesthesia Type:General  Level of Consciousness: awake, alert , patient cooperative and confused  Airway & Oxygen Therapy: Patient Spontanous Breathing and Patient connected to face mask oxygen  Post-op Assessment: Report given to RN, Post -op Vital signs reviewed and stable, Patient moving all extremities and Patient moving all extremities X 4  Post vital signs: Reviewed and stable  Last Vitals:  Filed Vitals:   05/17/15 0647  BP: 157/72  Pulse: 76  Temp: 36.2 C  Resp: 16    Complications: No apparent anesthesia complications

## 2015-05-17 NOTE — Progress Notes (Signed)
    Referring Physician(s): Dr Margo Aye  Chief Complaint:  Spinal AV fistula  Subjective:      S/P T 10 left intercostal artery and and L2 Right lumar artery Liquid onyx embolization of large spinal epidural AV fistula     Pt doing well in recovery Answering all questions appropriately  moves all 4s Dr Corliss Skains has seen and evaluated pt  Allergies: Review of patient's allergies indicates no known allergies.  Medications: Prior to Admission medications   Medication Sig Start Date End Date Taking? Authorizing Provider  acetaminophen (TYLENOL) 500 MG tablet Take 500 mg by mouth at bedtime.   Yes Historical Provider, MD  aspirin EC 81 MG tablet Take 81 mg by mouth daily.   Yes Historical Provider, MD  atenolol (TENORMIN) 25 MG tablet Take 25 mg by mouth daily.  05/12/13  Yes Historical Provider, MD  Iron-FA-B Cmp-C-Biot-Probiotic (FUSION PLUS PO) Take 1 tablet by mouth every morning.   Yes Historical Provider, MD  simvastatin (ZOCOR) 20 MG tablet Take 20 mg by mouth daily at 6 PM.  06/12/13  Yes Historical Provider, MD     Vital Signs: BP 127/67 mmHg  Pulse 80  Temp(Src) 97.7 F (36.5 C)  Resp 15  Wt 166 lb 3.2 oz (75.388 kg)  SpO2 100%  Physical Exam  Constitutional: He is oriented to person, place, and time.  HENT:  Face symmetrical Tongue medline  Eyes: EOM are normal.  Neck: Normal range of motion.  Abdominal: Soft. Bowel sounds are normal.  Rt groin NT no bleeding Clean and dry Soft;  No hematoma   Musculoskeletal: Normal range of motion.  Good strength = B Good sensation proprioception intact   Neurological: He is alert and oriented to person, place, and time.  Skin: Skin is warm and dry.  Nursing note and vitals reviewed.   Imaging: No results found.  Labs:  CBC:  Recent Labs  07/10/14 0055 04/15/15 0639 05/14/15 1358  WBC 6.7 6.7 5.7  HGB 11.7* 13.2 14.1  HCT 35.6* 39.9 40.9  PLT 215 178 170    COAGS:  Recent Labs   07/10/14 0055 04/15/15 0639 05/14/15 1358  INR 0.95 1.08 1.05  APTT 29 34 35    BMP:  Recent Labs  07/10/14 0055 04/15/15 0639 05/14/15 1358  NA 139 140 145  K 3.8 3.7 3.8  CL 107 107 108  CO2 GLUCOSE 127* 112* 108*  BUN CALCIUM 8.8 9.3 9.0  CREATININE 1.04 1.21 1.09  GFRNONAA 69* 57* >60  GFRAA 80* >60 >60    LIVER FUNCTION TESTS:  Recent Labs  07/10/14 0055 04/15/15 0639 05/14/15 1358  BILITOT 0.5 0.8 0.6  AST 32 27 32  ALT 16 16* 20  ALKPHOS 52 50 58  PROT 7.3 6.8 6.7  ALBUMIN 3.8 3.9 3.8    Assessment and Plan:      S/P T 10 left intercostal artery and and L2 Right lumar artery Liquid onyx embolization of large spinal epidural AV fistula     Admit to Neuro ICU overnight Plan for probable dc in am  Electronically Signed: Raynelle Fujikawa A 05/17/2015, 3:06 PM   I spent a total of 15 Minutes at the the patient's bedside AND on the patient's hospital floor or unit, greater than 50% of which was counseling/coordinating care for T 10 L intercostal artery and L2 Rt lumbar artery embolization

## 2015-05-18 ENCOUNTER — Encounter (HOSPITAL_COMMUNITY): Payer: Self-pay | Admitting: Interventional Radiology

## 2015-05-18 ENCOUNTER — Inpatient Hospital Stay (HOSPITAL_COMMUNITY): Payer: Medicare HMO

## 2015-05-18 LAB — BASIC METABOLIC PANEL
Anion gap: 12 (ref 5–15)
BUN: 10 mg/dL (ref 6–20)
CALCIUM: 8.9 mg/dL (ref 8.9–10.3)
CO2: 25 mmol/L (ref 22–32)
Chloride: 106 mmol/L (ref 101–111)
Creatinine, Ser: 1.02 mg/dL (ref 0.61–1.24)
GFR calc Af Amer: >60 mL/min (ref >60)
GLUCOSE: 124 mg/dL — AB (ref 65–99)
Potassium: 4.1 mmol/L (ref 3.5–5.1)
Sodium: 143 mmol/L (ref 135–145)

## 2015-05-18 LAB — CBC WITH DIFFERENTIAL/PLATELET
BASOS ABS: 0 10*3/uL (ref 0.0–0.1)
BASOS PCT: 0 %
EOS PCT: 0 %
Eosinophils Absolute: 0 10*3/uL (ref 0.0–0.7)
HCT: 38.1 % — ABNORMAL LOW (ref 39.0–52.0)
Hemoglobin: 12.8 g/dL — ABNORMAL LOW (ref 13.0–17.0)
Lymphocytes Relative: 7 %
Lymphs Abs: 0.7 10*3/uL (ref 0.7–4.0)
MCH: 31.2 pg (ref 26.0–34.0)
MCHC: 33.6 g/dL (ref 30.0–36.0)
MCV: 92.9 fL (ref 78.0–100.0)
MONO ABS: 0.5 10*3/uL (ref 0.1–1.0)
Monocytes Relative: 5 %
Neutro Abs: 8.9 10*3/uL — ABNORMAL HIGH (ref 1.7–7.7)
Neutrophils Relative %: 88 %
PLATELETS: 173 10*3/uL (ref 150–400)
RBC: 4.1 MIL/uL — ABNORMAL LOW (ref 4.22–5.81)
RDW: 13.8 % (ref 11.5–15.5)
WBC: 10.1 10*3/uL (ref 4.0–10.5)

## 2015-05-18 MED ORDER — GADOBENATE DIMEGLUMINE 529 MG/ML IV SOLN
16.0000 mL | Freq: Once | INTRAVENOUS | Status: AC | PRN
  Administered 2015-05-18: 16 mL via INTRAVENOUS

## 2015-05-18 NOTE — Care Management Note (Signed)
Case Management Note  Patient Details  Name: Jeremiah Murphy MRN: [id number] Date of Birth: December 30, 1940  Subjective/Objective:    Pt admitted on 05/17/15 s/p embolization of spinal AV fistula.  PTA, pt resides at home with spouse.              Action/Plan: Pt for dc home with wife; no dc needs anticipated.  Expected Discharge Date:    01/24/12017              Expected Discharge Plan:   Home/self care  In-House Referral:     Discharge planning Services   CM Referral  Post Acute Care Choice:    Choice offered to:     DME Arranged:    DME Agency:     HH Arranged:    HH Agency:     Status of Service:   Completed  Medicare Important Message Given:    Date Medicare IM Given:    Medicare IM give by:    Date Additional Medicare IM Given:    Additional Medicare Important Message give by:     If discussed at Long Length of Stay Meetings, dates discussed:    Additional Comments:  Quintella Baton, RN, BSN  Trauma/Neuro ICU Case Manager [phone]

## 2015-05-18 NOTE — Progress Notes (Signed)
Pt is ready for discharge, wife is picking him up at the main entrance.

## 2015-05-18 NOTE — Progress Notes (Signed)
Referring Physician(s): Dr Hughie Closs  Chief Complaint:  Spinal dural AV fistula  Subjective:  Embolization 1/23 in IR S/P T 10 left intercostal artery and and L2 Right lumar artery Liquid onyx embolization of large spinal epidural AV fistula  Doing well this am No complaints No pain Moves all 4s    Allergies: Review of patient's allergies indicates no known allergies.  Medications: Prior to Admission medications   Medication Sig Start Date End Date Taking? Authorizing Provider  acetaminophen (TYLENOL) 500 MG tablet Take 500 mg by mouth at bedtime.   Yes Historical Provider, MD  aspirin EC 81 MG tablet Take 81 mg by mouth daily.   Yes Historical Provider, MD  atenolol (TENORMIN) 25 MG tablet Take 25 mg by mouth daily.  05/12/13  Yes Historical Provider, MD  Iron-FA-B Cmp-C-Biot-Probiotic (FUSION PLUS PO) Take 1 tablet by mouth every morning.   Yes Historical Provider, MD  simvastatin (ZOCOR) 20 MG tablet Take 20 mg by mouth daily at 6 PM.  06/12/13  Yes Historical Provider, MD     Vital Signs: BP 128/59 mmHg  Pulse 78  Temp(Src) 97.9 F (36.6 C) (Oral)  Resp 22  Ht  (1.727 m)  Wt 163 lb 5.8 oz (74.1 kg)  BMI 24.84 kg/m2  SpO2 97%  Physical Exam  Constitutional: He is oriented to person, place, and time. He appears well-nourished.  Neck: Normal range of motion.  Abdominal: Soft. Bowel sounds are normal.  Musculoskeletal: Normal range of motion. He exhibits no edema or tenderness.  Good strength B Good sensation B    Neurological: He is alert and oriented to person, place, and time.  Smile = Puffs cheeks =  Skin: Skin is warm and dry.  Rt groin NT no bleeding no hematoma Rt foot 2+ pulses  Psychiatric: He has a normal mood and affect. His behavior is normal. Thought content normal.  Nursing note and vitals reviewed.   Imaging: No results found.  Labs:  CBC:  Recent Labs  07/10/14 0055 04/15/15 0639 05/14/15 1358 05/18/15 0342  WBC 6.7  6.7 5.7 10.1  HGB 11.7* 13.2 14.1 12.8*  HCT 35.6* 39.9 40.9 38.1*  PLT 215 178 170 173    COAGS:  Recent Labs  07/10/14 0055 04/15/15 0639 05/14/15 1358  INR 0.95 1.08 1.05  APTT 29 34 35    BMP:  Recent Labs  07/10/14 0055 04/15/15 0639 05/14/15 1358 05/18/15 0342  NA 139 140 145 143  K 3.8 3.7 3.8 4.1  CL 107 107 108 106  CO2 GLUCOSE 127* 112* 108* 124*  BUN CALCIUM 8.8 9.3 9.0 8.9  CREATININE 1.04 1.21 1.09 1.02  GFRNONAA 69* 57* >60 >60  GFRAA 80* >60 >60 >60    LIVER FUNCTION TESTS:  Recent Labs  07/10/14 0055 04/15/15 0639 05/14/15 1358  BILITOT 0.5 0.8 0.6  AST 32 27 32  ALT 16 16* 20  ALKPHOS 52 50 58  PROT 7.3 6.8 6.7  ALBUMIN 3.8 3.9 3.8    Assessment and Plan:  Spinal dural AV fistula embolization 1/23 in IR Doing well Dr Corliss Skains has seen and examined pt For MRI T spine before DC   Electronically Signed: Vence Lalor A 05/18/2015, 8:54 AM   I spent a total of 15 Minutes at the the patient's bedside AND on the patient's hospital floor or unit, greater than 50% of which was counseling/coordinating care for embolization

## 2015-05-18 NOTE — Plan of Care (Signed)
Problem: Phase I Progression Outcomes Goal: Voiding-avoid urinary catheter unless indicated Outcome: Completed/Met Date Met:  05/18/15 Catheter removed and pt has voided on his own.  Problem: Phase II Progression Outcomes Goal: Pain controlled with appropriate interventions Outcome: Completed/Met Date Met:  05/18/15 Pt reports no pain Goal: Ambulates up to 600 ft. in hall x 1 Outcome: Completed/Met Date Met:  05/18/15 Ambulation without any issues multiple times around the unit.  Goal: Vascular site scale level 0 - I Vascular Site Scale Level 0: No bruising/bleeding/hematoma Level I (Mild): Bruising/Ecchymosis, minimal bleeding/ooozing, palpable hematoma < 3 cm Level II (Moderate): Bleeding not affecting hemodynamic parameters, pseudoaneurysm, palpable hematoma > 3 cm Level III (Severe) Bleeding which affects hemodynamic parameters or retroperitoneal hemorrhage  Outcome: Completed/Met Date Met:  05/18/15 Level o, no issues at groin site

## 2015-05-18 NOTE — Discharge Summary (Signed)
Patient ID: Jeremiah Murphy MRN: [id number] DOB/AGE: December 27, 1940 75 y.o.  Admit date: 05/17/2015 Discharge date: 05/18/2015  Admission Diagnoses: spinal dural arteriovenous fistula  Discharge Diagnoses:  Active Problems:   Arteriovenous fistula of spinal cord vessels   Discharged Condition: stable  Hospital Course: Spinal dural arteriovenous fistula embolization in IR with Dr Corliss Skains performed 05/17/15. Pt tolerated procedure well. Admitted to Neuro ICU for overnight observation. Slept well; eating well No complaints this am. Denies pain; denies headache; denies back pain States symptoms of pain and numbness in legs has already diminished since procedure +passing gas; urinating well Site of Rt groin area is without pain or hematoma. Pt has been seen and examined by Dr Corliss Skains MRI T spine w and w/o cx before discharge  Consults: None  Significant Diagnostic Studies: spinal arteriogram  Treatments: T 10 left intercostal artery and and L2 Right lumbar artery Liquid onyx embolization of large spinal epidural AV fistula  Discharge Exam: Blood pressure 106/67, pulse 71, temperature 96.5 F (35.8 C), temperature source Axillary, resp. rate 17, height  (1.727 m), weight 163 lb 5.8 oz (74.1 kg), SpO2 99 %.  PE:  A/O; appropriate Pleasant Face symmetrical; speech wnl Smile = Puffs cheeks = Tongue midline Heart: RRR Lungs: CTA Abd: soft; +BS; NT Ext: FROM Good sensation B; good strength B Rt groin NT; no bleeding; no hematoma Rt foot: 2+ pulses  Results for orders placed or performed during the hospital encounter of 05/17/15  Basic metabolic panel  Result Value Ref Range   Sodium 143 135 - 145 mmol/L   Potassium 4.1 3.5 - 5.1 mmol/L   Chloride 106 101 - 111 mmol/L   CO2 25 22 - 32 mmol/L   Glucose, Bld 124 (H) 65 - 99 mg/dL   BUN 10 6 - 20 mg/dL   Creatinine, Ser 4.09 0.61 - 1.24 mg/dL   Calcium 8.9 8.9 - 81.1 mg/dL   GFR calc non Af Amer >60 >60  mL/min   GFR calc Af Amer >60 >60 mL/min   Anion gap 12 5 - 15  CBC WITH DIFFERENTIAL  Result Value Ref Range   WBC 10.1 4.0 - 10.5 K/uL   RBC 4.10 (L) 4.22 - 5.81 MIL/uL   Hemoglobin 12.8 (L) 13.0 - 17.0 g/dL   HCT 91.4 (L) 78.2 - 95.6 %   MCV 92.9 78.0 - 100.0 fL   MCH 31.2 26.0 - 34.0 pg   MCHC 33.6 30.0 - 36.0 g/dL   RDW 21.3 08.6 - 57.8 %   Platelets 173 150 - 400 K/uL   Neutrophils Relative % 88 %   Neutro Abs 8.9 (H) 1.7 - 7.7 K/uL   Lymphocytes Relative 7 %   Lymphs Abs 0.7 0.7 - 4.0 K/uL   Monocytes Relative 5 %   Monocytes Absolute 0.5 0.1 - 1.0 K/uL   Eosinophils Relative 0 %   Eosinophils Absolute 0.0 0.0 - 0.7 K/uL   Basophils Relative 0 %   Basophils Absolute 0.0 0.0 - 0.1 K/uL    Disposition: Spinal dural arteriovenous fistula embolization 1/23 in IR with Dr Corliss Skains Pt for discharge to home today T spine MRI complete---will be reviewed by Dr Corliss Skains Dr Corliss Skains has seen and examine pt Continue all home meds Plan for follow up in 2 weeks with Dr Corliss Skains Pt will hear from scheduler with time and date Pt has good understanding of all discharge instrutctions   Discharge Instructions    Call MD for:  difficulty breathing, headache  or visual disturbances    Complete by:  As directed      Call MD for:  extreme fatigue    Complete by:  As directed      Call MD for:  hives    Complete by:  As directed      Call MD for:  persistant dizziness or light-headedness    Complete by:  As directed      Call MD for:  persistant nausea and vomiting    Complete by:  As directed      Call MD for:  redness, tenderness, or signs of infection (pain, swelling, redness, odor or green/yellow discharge around incision site)    Complete by:  As directed      Call MD for:  severe uncontrolled pain    Complete by:  As directed      Call MD for:  temperature >100.4    Complete by:  As directed      Diet - low sodium heart healthy    Complete by:  As directed      Discharge  instructions    Complete by:  As directed   Continue all home meds; follow up with Dr Corliss Skains 2 weeks; call [phone] if questions or concerns     Discharge wound care:    Complete by:  As directed   May shower today; keep clean band aid on Rt groin site daily x 1 week     Driving Restrictions    Complete by:  As directed   No driving x 2 weeks     Increase activity slowly    Complete by:  As directed      Lifting restrictions    Complete by:  As directed   No lifting over 10 lbs x 2 weeks            Medication List    TAKE these medications        acetaminophen 500 MG tablet  Commonly known as:  TYLENOL  Take 500 mg by mouth at bedtime.     aspirin EC 81 MG tablet  Take 81 mg by mouth daily.     atenolol 25 MG tablet  Commonly known as:  TENORMIN  Take 25 mg by mouth daily.     FUSION PLUS PO  Take 1 tablet by mouth every morning.     simvastatin 20 MG tablet  Commonly known as:  ZOCOR  Take 20 mg by mouth daily at 6 PM.           Follow-up Information    Follow up with DEVESHWAR, Grandville Silos, MD In 2 weeks.   Specialty:  Interventional Radiology   Why:  pt will hear from scheduler for time and date of follow up appt   Contact information:   [address] [place] [address] [phone]        Electronically Signed: Ralene Muskrat A 05/18/2015, 12:27 PM   I have spent Greater Than 30 Minutes discharging Jeremiah Murphy.

## 2015-06-07 ENCOUNTER — Ambulatory Visit (HOSPITAL_COMMUNITY)
Admission: RE | Admit: 2015-06-07 | Discharge: 2015-06-07 | Disposition: A | Discharge status: Home / Self Care | Payer: Commercial Managed Care - HMO | Source: Ambulatory Visit | Attending: Radiology | Admitting: Radiology

## 2015-06-07 DIAGNOSIS — I770 Arteriovenous fistula, acquired: Secondary | ICD-10-CM

## 2015-06-09 ENCOUNTER — Other Ambulatory Visit: Payer: Self-pay | Admitting: Physician Assistant

## 2015-06-10 ENCOUNTER — Ambulatory Visit (HOSPITAL_COMMUNITY)
Admission: RE | Admit: 2015-06-10 | Discharge: 2015-06-10 | Disposition: A | Discharge status: Home / Self Care | Payer: Commercial Managed Care - HMO | Source: Ambulatory Visit | Attending: Radiology | Admitting: Radiology

## 2015-06-10 DIAGNOSIS — Q2739 Arteriovenous malformation, other site: Secondary | ICD-10-CM | POA: Diagnosis not present

## 2015-06-14 DIAGNOSIS — D509 Iron deficiency anemia, unspecified: Secondary | ICD-10-CM | POA: Diagnosis not present

## 2015-06-14 DIAGNOSIS — I10 Essential (primary) hypertension: Secondary | ICD-10-CM | POA: Diagnosis not present

## 2015-06-14 DIAGNOSIS — E782 Mixed hyperlipidemia: Secondary | ICD-10-CM | POA: Diagnosis not present

## 2015-06-16 DIAGNOSIS — I10 Essential (primary) hypertension: Secondary | ICD-10-CM | POA: Diagnosis not present

## 2015-06-16 DIAGNOSIS — D509 Iron deficiency anemia, unspecified: Secondary | ICD-10-CM | POA: Diagnosis not present

## 2015-06-16 DIAGNOSIS — E782 Mixed hyperlipidemia: Secondary | ICD-10-CM | POA: Diagnosis not present

## 2015-06-17 ENCOUNTER — Other Ambulatory Visit (HOSPITAL_COMMUNITY): Payer: Self-pay | Admitting: Interventional Radiology

## 2015-06-17 DIAGNOSIS — Q288 Other specified congenital malformations of circulatory system: Secondary | ICD-10-CM

## 2015-07-07 DIAGNOSIS — H1131 Conjunctival hemorrhage, right eye: Secondary | ICD-10-CM | POA: Diagnosis not present

## 2015-07-16 ENCOUNTER — Other Ambulatory Visit: Payer: Self-pay | Admitting: Radiology

## 2015-07-20 ENCOUNTER — Encounter (HOSPITAL_COMMUNITY)
Admission: RE | Admit: 2015-07-20 | Discharge: 2015-07-20 | Disposition: A | Discharge status: Home / Self Care | Payer: Commercial Managed Care - HMO | Source: Ambulatory Visit | Attending: Rheumatology | Admitting: Rheumatology

## 2015-07-20 ENCOUNTER — Encounter (HOSPITAL_COMMUNITY): Payer: Self-pay

## 2015-07-20 DIAGNOSIS — E785 Hyperlipidemia, unspecified: Secondary | ICD-10-CM | POA: Diagnosis not present

## 2015-07-20 DIAGNOSIS — I10 Essential (primary) hypertension: Secondary | ICD-10-CM | POA: Insufficient documentation

## 2015-07-20 DIAGNOSIS — Z01812 Encounter for preprocedural laboratory examination: Secondary | ICD-10-CM | POA: Diagnosis not present

## 2015-07-20 DIAGNOSIS — Z01818 Encounter for other preprocedural examination: Secondary | ICD-10-CM | POA: Insufficient documentation

## 2015-07-20 DIAGNOSIS — Q2739 Arteriovenous malformation, other site: Secondary | ICD-10-CM | POA: Diagnosis not present

## 2015-07-20 DIAGNOSIS — I452 Bifascicular block: Secondary | ICD-10-CM | POA: Insufficient documentation

## 2015-07-20 HISTORY — DX: Atherosclerotic heart disease of native coronary artery without angina pectoris: I25.10

## 2015-07-20 LAB — CBC WITH DIFFERENTIAL/PLATELET
BASOS ABS: 0.1 10*3/uL (ref 0.0–0.1)
BASOS PCT: 1 %
EOS PCT: 3 %
Eosinophils Absolute: 0.2 10*3/uL (ref 0.0–0.7)
HCT: 41.8 % (ref 39.0–52.0)
Hemoglobin: 14.3 g/dL (ref 13.0–17.0)
LYMPHS PCT: 18 %
Lymphs Abs: 1.1 10*3/uL (ref 0.7–4.0)
MCH: 32.7 pg (ref 26.0–34.0)
MCHC: 34.2 g/dL (ref 30.0–36.0)
MCV: 95.7 fL (ref 78.0–100.0)
MONO ABS: 0.5 10*3/uL (ref 0.1–1.0)
Monocytes Relative: 7 %
NEUTROS ABS: 4.4 10*3/uL (ref 1.7–7.7)
Neutrophils Relative %: 71 %
Platelets: 202 10*3/uL (ref 150–400)
RBC: 4.37 MIL/uL (ref 4.22–5.81)
RDW: 13.1 % (ref 11.5–15.5)
WBC: 6.2 10*3/uL (ref 4.0–10.5)

## 2015-07-20 LAB — COMPREHENSIVE METABOLIC PANEL
ALBUMIN: 4 g/dL (ref 3.5–5.0)
ALK PHOS: 64 U/L (ref 38–126)
ALT: 20 U/L (ref 17–63)
ANION GAP: 10 (ref 5–15)
AST: 31 U/L (ref 15–41)
BILIRUBIN TOTAL: 1 mg/dL (ref 0.3–1.2)
BUN: 11 mg/dL (ref 6–20)
CO2: 25 mmol/L (ref 22–32)
Calcium: 9.3 mg/dL (ref 8.9–10.3)
Chloride: 108 mmol/L (ref 101–111)
Creatinine, Ser: 1.16 mg/dL (ref 0.61–1.24)
GFR calc Af Amer: >60 mL/min (ref >60)
GFR calc non Af Amer: 60 mL/min — ABNORMAL LOW (ref >60)
GLUCOSE: 117 mg/dL — AB (ref 65–99)
Potassium: 3.8 mmol/L (ref 3.5–5.1)
SODIUM: 143 mmol/L (ref 135–145)
Total Protein: 7.3 g/dL (ref 6.5–8.1)

## 2015-07-20 LAB — PROTIME-INR
INR: 1.09 (ref 0.00–1.49)
PROTHROMBIN TIME: 14.2 s (ref 11.6–15.2)

## 2015-07-20 LAB — APTT: APTT: 34 s (ref 24–37)

## 2015-07-20 NOTE — Pre-Procedure Instructions (Signed)
[name] Warbington  07/20/2015      [facility] - [place], [place] - [address] [place] [address] Phone: [phone] Fax: [phone]    Your procedure is scheduled on Monday April 3rd 2017.  [facility] to [facility] Admitting at 630 A.M.  Call this number if you have problems the morning of surgery:  [phone]   Remember:  Do not eat food or drink liquids after midnight.  Take these medicines the morning of surgery with A SIP OF WATER tylenol (acetaminophen) if needed, aspirin, atenolol (tenormin), cetirizine (zyrtec) if needed, eye drops if needed  STOP: ALL Vitamins, Supplements, Effient and Herbal Medications, Fish Oils, NSAIDs (Nonsteroidal Anti-inflammatories such as Ibuprofen, Aleve, or Advil), and Goody's/BC Powders 5-7 days prior to surgery.   Do not wear jewelry.  Do not wear lotions, powders, or perfumes.  You may wear deodorant.  Do not shave 48 hours prior to surgery.  Men may shave face and neck.  Do not bring valuables to the hospital.  [facility] is not responsible for any belongings or valuables.  Contacts, dentures or bridgework may not be worn into surgery.  Leave your suitcase in the car.  After surgery it may be brought to your room.  For patients admitted to the hospital, discharge time will be determined by your treatment team.  Patients discharged the day of surgery will not be allowed to drive home.        Preparing for Surgery at [facility]  Before surgery, you can play an important role.  Because skin is not sterile, your skin needs to be as free of germs as possible.  You can reduce the number of germs on your skin by washing with CHG (chlorahexidine gluconate) Soap before surgery.  CHG is an antiseptic cleaner with kills germs and bonds with the skin to continue killing germs even after washing.   Please do not use if you have an allergy to CHG or antibacterial soaps.  If your skin becomes  reddened/irritated stop using the CHG.  Do not shave (including legs and underarms) for at least 48 hours prior to first CHG shower.  It is okay to shave your face.  Please follow these instructions carefully:  1. Shower with CHG Soap the night before surgery and the morning of Surgery. 2. If you choose to wash your hair, wash your hair first as usual with your normal shampoo. 3. After you shampoo, rinse your hair and body thoroughly to remove the Shampoo. 4. Use CHG as you would any other liquid soap. You can apply chg directly to the skin and wash gently with scrungie or a clean washcloth. 5. Apply the CHG Soap to your body ONLY FROM THE NECK DOWN. Do not use on open wounds or open sores. Avoid contact with your eyes, ears, mouth and genitals (private parts). Wash genitals (private parts) with your normal soap. 6. Wash thoroughly, paying special attention to the area where your surgery will be performed. 7. Thoroughly rinse your body with warm water from the neck down. 8. DO NOT shower/wash with your normal soap after using and rinsing off the CHG Soap. 9. Pat yourself dry with a clean towel.  10. Wear clean pajamas.  11. Place clean sheets on your bed the night of your first shower and do not sleep with pets.  Day of Surgery  Do not apply any lotions/deodorants the morning of surgery. Please wear clean clothes to  the hospital/surgery center.   Please read over the following fact sheets that you were given. Pain Booklet, Coughing and Deep Breathing, MRSA Information and Surgical Site Infection Prevention

## 2015-07-21 ENCOUNTER — Encounter (HOSPITAL_COMMUNITY): Payer: Self-pay

## 2015-07-21 ENCOUNTER — Other Ambulatory Visit: Payer: Self-pay | Admitting: Radiology

## 2015-07-21 NOTE — Progress Notes (Signed)
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 Corliss Skainseveshwar. He is s/p spinal and cerebral arteriogram on 04/15/15 showing a prominent mixed and thoracolumbar AVM. S/P Liquid onyx embolization of large spinal epidural AVM 05/17/15. Stage II embolization of two more branches feeding his spinal epidural AVM recommended.   Other history includes HTN, normocytic anemia, vertigo, non-smoker, dyslipidemia, arthritis, tonsillectomy, irregular heart rate (~ '99 in the setting of anxiety attack; afib DID NOT sound familiar to patient), remote cardiac cath (possible intervention; [place] ~ 2003) .   PCP is Dr. Dwana MelenaZack Hall. Prior to one of his previous procedures we had attempted to contact Dr. Margo AyeHall for information about patient's cardiac history (patient thought records had been sent there), but were only able to leave a voice message. Patient ultimately underwent the above IR procedures as he reported recent history of [facility] and was able to ride a stationary bike for 8-10 minutes or walk on a treadmill for 5-10 minutes without CV symptoms. Today I was able to talk with Angie at Dr. Scharlene GlossHall's office. 2004-2008 records from [place] reviewed. Internist then was Dr. Rocky LinkScott Himenda in [place]. Last cardiology note received from 01/16/04 by Dr. Madalyn RobWilliam Dang. His notes outline the following: - 10/28/02 Echo: Left atrial enlargement with EF 55-60%. - 11/03/02 Nuclear stress test: Moderate sized anterior apical ischemia with no infarction and EF 68%. - 11/25/02 Cardiac cath: Moderate 2V CAD. 60-% proximal LCX and 90% distal LCX. Total occlusion RCA. Medical management. (I will update his history.)  Meds include ASA 81mg , Zyrtec, Zocor, atenolol, simvastin.  07/20/15 EKG: NSR, incomplete right BBB, LAFB. There is no significant change when compared to his 07/10/14 tracing.   Preoperative labs noted.    Reviewed above with anesthesiologist Dr. Okey Dupreose. Patient has undergone two IR procedures under anesthesia within the past 3-4 months. His EKG is stable. No reported CV symptoms at PAT. If no acute changes then it is anticipated that he can proceed with this IR procedure as well.   Velna Ochsllison Thelmer Legler, PA-C [facility] Short Stay Center/Anesthesiology Phone [phone] 07/21/2015 2:49 PM

## 2015-07-26 ENCOUNTER — Inpatient Hospital Stay (HOSPITAL_COMMUNITY)
Admission: AD | Admit: 2015-07-26 | Discharge: 2015-07-27 | DRG: 254 | Disposition: A | Discharge status: Home / Self Care | Payer: Commercial Managed Care - HMO | Source: Ambulatory Visit | Attending: Interventional Radiology | Admitting: Interventional Radiology

## 2015-07-26 ENCOUNTER — Ambulatory Visit (HOSPITAL_COMMUNITY)
Admission: RE | Admit: 2015-07-26 | Discharge: 2015-07-26 | Disposition: A | Discharge status: Home / Self Care | Payer: Commercial Managed Care - HMO | Source: Ambulatory Visit | Attending: Interventional Radiology | Admitting: Interventional Radiology

## 2015-07-26 ENCOUNTER — Ambulatory Visit (HOSPITAL_COMMUNITY): Payer: Commercial Managed Care - HMO | Admitting: Anesthesiology

## 2015-07-26 ENCOUNTER — Encounter (HOSPITAL_COMMUNITY)
Admission: AD | Disposition: A | Discharge status: Home / Self Care | Payer: Self-pay | Source: Ambulatory Visit | Attending: Interventional Radiology

## 2015-07-26 ENCOUNTER — Ambulatory Visit (HOSPITAL_COMMUNITY): Payer: Commercial Managed Care - HMO | Admitting: Emergency Medicine

## 2015-07-26 ENCOUNTER — Encounter (HOSPITAL_COMMUNITY): Payer: Self-pay | Admitting: *Deleted

## 2015-07-26 DIAGNOSIS — Q2739 Arteriovenous malformation, other site: Secondary | ICD-10-CM | POA: Diagnosis not present

## 2015-07-26 DIAGNOSIS — Z9841 Cataract extraction status, right eye: Secondary | ICD-10-CM

## 2015-07-26 DIAGNOSIS — Q288 Other specified congenital malformations of circulatory system: Secondary | ICD-10-CM

## 2015-07-26 DIAGNOSIS — I10 Essential (primary) hypertension: Secondary | ICD-10-CM | POA: Diagnosis not present

## 2015-07-26 DIAGNOSIS — I2510 Atherosclerotic heart disease of native coronary artery without angina pectoris: Secondary | ICD-10-CM | POA: Diagnosis present

## 2015-07-26 DIAGNOSIS — I770 Arteriovenous fistula, acquired: Secondary | ICD-10-CM | POA: Diagnosis not present

## 2015-07-26 DIAGNOSIS — I499 Cardiac arrhythmia, unspecified: Secondary | ICD-10-CM | POA: Diagnosis not present

## 2015-07-26 DIAGNOSIS — Z79899 Other long term (current) drug therapy: Secondary | ICD-10-CM | POA: Diagnosis not present

## 2015-07-26 DIAGNOSIS — Z9842 Cataract extraction status, left eye: Secondary | ICD-10-CM | POA: Diagnosis not present

## 2015-07-26 DIAGNOSIS — M1990 Unspecified osteoarthritis, unspecified site: Secondary | ICD-10-CM | POA: Diagnosis not present

## 2015-07-26 DIAGNOSIS — Z7982 Long term (current) use of aspirin: Secondary | ICD-10-CM

## 2015-07-26 DIAGNOSIS — E785 Hyperlipidemia, unspecified: Secondary | ICD-10-CM | POA: Diagnosis not present

## 2015-07-26 DIAGNOSIS — Q282 Arteriovenous malformation of cerebral vessels: Secondary | ICD-10-CM | POA: Diagnosis not present

## 2015-07-26 HISTORY — PX: RADIOLOGY WITH ANESTHESIA: SHX6223

## 2015-07-26 LAB — MRSA PCR SCREENING: MRSA by PCR: NEGATIVE

## 2015-07-26 LAB — POCT ACTIVATED CLOTTING TIME: Activated Clotting Time: 188 seconds

## 2015-07-26 SURGERY — RADIOLOGY WITH ANESTHESIA
Anesthesia: General

## 2015-07-26 MED ORDER — LACTATED RINGERS IV SOLN
INTRAVENOUS | Status: DC
  Administered 2015-07-26 (×3): via INTRAVENOUS

## 2015-07-26 MED ORDER — PROPOFOL 10 MG/ML IV BOLUS
INTRAVENOUS | Status: DC | PRN
  Administered 2015-07-26: 120 mg via INTRAVENOUS

## 2015-07-26 MED ORDER — SODIUM CHLORIDE 0.9 % IV SOLN
INTRAVENOUS | Status: DC
  Administered 2015-07-26: 15:00:00 via INTRAVENOUS
  Administered 2015-07-27: 1000 mL via INTRAVENOUS

## 2015-07-26 MED ORDER — SODIUM CHLORIDE 0.9 % IV BOLUS (SEPSIS): 1000.0000 mL | Freq: Once | INTRAVENOUS | Status: DC

## 2015-07-26 MED ORDER — PHENYLEPHRINE HCL 10 MG/ML IJ SOLN
10.0000 mg | INTRAVENOUS | Status: DC | PRN
  Administered 2015-07-26: 10 ug/min via INTRAVENOUS

## 2015-07-26 MED ORDER — ROCURONIUM BROMIDE 100 MG/10ML IV SOLN
INTRAVENOUS | Status: DC | PRN
  Administered 2015-07-26 (×3): 10 mg via INTRAVENOUS
  Administered 2015-07-26: 50 mg via INTRAVENOUS
  Administered 2015-07-26: 10 mg via INTRAVENOUS

## 2015-07-26 MED ORDER — DEXAMETHASONE SODIUM PHOSPHATE 10 MG/ML IJ SOLN
INTRAMUSCULAR | Status: DC | PRN
  Administered 2015-07-26: 10 mg via INTRAVENOUS

## 2015-07-26 MED ORDER — FENTANYL CITRATE (PF) 100 MCG/2ML IJ SOLN
INTRAMUSCULAR | Status: DC | PRN
  Administered 2015-07-26: 100 ug via INTRAVENOUS

## 2015-07-26 MED ORDER — ONDANSETRON HCL 4 MG/2ML IJ SOLN: 4.0000 mg | Freq: Four times a day (QID) | INTRAMUSCULAR | Status: DC | PRN

## 2015-07-26 MED ORDER — MIDAZOLAM HCL 5 MG/5ML IJ SOLN
INTRAMUSCULAR | Status: DC | PRN
  Administered 2015-07-26: 1 mg via INTRAVENOUS

## 2015-07-26 MED ORDER — HEPARIN SODIUM (PORCINE) 1000 UNIT/ML IJ SOLN
INTRAMUSCULAR | Status: DC | PRN
  Administered 2015-07-26: 500 [IU] via INTRAVENOUS
  Administered 2015-07-26: 3000 [IU] via INTRAVENOUS
  Administered 2015-07-26: 500 [IU] via INTRAVENOUS

## 2015-07-26 MED ORDER — NICARDIPINE HCL IN NACL 20-0.86 MG/200ML-% IV SOLN: 5.0000 mg/h | INTRAVENOUS | Status: DC

## 2015-07-26 MED ORDER — CEFAZOLIN SODIUM-DEXTROSE 2-4 GM/100ML-% IV SOLN
2.0000 g | INTRAVENOUS | Status: AC
  Administered 2015-07-26: 2 g via INTRAVENOUS

## 2015-07-26 MED ORDER — IOPAMIDOL (ISOVUE-300) INJECTION 61%
300.0000 mL | Freq: Once | INTRAVENOUS | Status: AC | PRN
  Administered 2015-07-26: 150 mL via INTRAVENOUS

## 2015-07-26 MED ORDER — PANTOPRAZOLE SODIUM 40 MG IV SOLR
40.0000 mg | INTRAVENOUS | Status: AC
  Administered 2015-07-26: 40 mg via INTRAVENOUS
  Filled 2015-07-26: qty 40

## 2015-07-26 MED ORDER — LIDOCAINE HCL (CARDIAC) 20 MG/ML IV SOLN
INTRAVENOUS | Status: DC | PRN
  Administered 2015-07-26: 80 mg via INTRAVENOUS

## 2015-07-26 MED ORDER — CEFAZOLIN SODIUM-DEXTROSE 2-4 GM/100ML-% IV SOLN
INTRAVENOUS | Status: AC
Start: 2015-07-26 — End: 2015-07-26
  Filled 2015-07-26: qty 100

## 2015-07-26 MED ORDER — ACETAMINOPHEN 500 MG PO TABS: 1000.0000 mg | ORAL_TABLET | Freq: Four times a day (QID) | ORAL | Status: DC | PRN

## 2015-07-26 MED ORDER — IOPAMIDOL (ISOVUE-300) INJECTION 61%
INTRAVENOUS | Status: AC
  Filled 2015-07-26: qty 300

## 2015-07-26 MED ORDER — SUGAMMADEX SODIUM 200 MG/2ML IV SOLN
INTRAVENOUS | Status: DC | PRN
  Administered 2015-07-26: 200 mg via INTRAVENOUS

## 2015-07-26 MED ORDER — SODIUM CHLORIDE 0.9 % IV SOLN: INTRAVENOUS | Status: DC

## 2015-07-26 MED ORDER — PROTAMINE SULFATE 10 MG/ML IV SOLN
INTRAVENOUS | Status: DC | PRN
  Administered 2015-07-26: 5 mg via INTRAVENOUS

## 2015-07-26 MED ORDER — FENTANYL CITRATE (PF) 100 MCG/2ML IJ SOLN: 25.0000 ug | INTRAMUSCULAR | Status: DC | PRN

## 2015-07-26 MED ORDER — GLYCOPYRROLATE 0.2 MG/ML IJ SOLN
INTRAMUSCULAR | Status: DC | PRN
  Administered 2015-07-26: 0.2 mg via INTRAVENOUS

## 2015-07-26 MED ORDER — ACETAMINOPHEN 650 MG RE SUPP
650.0000 mg | Freq: Four times a day (QID) | RECTAL | Status: DC | PRN
Start: 2015-07-26 — End: 2015-07-27

## 2015-07-26 MED ORDER — NITROGLYCERIN 1 MG/10 ML FOR IR/CATH LAB
INTRA_ARTERIAL | Status: AC
  Filled 2015-07-26: qty 10

## 2015-07-26 NOTE — Anesthesia Preprocedure Evaluation (Addendum)
Anesthesia Evaluation  Patient identified by MRN, date of birth, ID band Patient awake    Reviewed: Allergy & Precautions, NPO status , Patient's Chart, lab work & pertinent test results  Airway Mallampati: II  TM Distance: >3 FB Neck ROM: Full    Dental no notable dental hx. (+) Partial Upper, Dental Advisory Given   Pulmonary neg pulmonary ROS, pneumonia, resolved,    Pulmonary exam normal breath sounds clear to auscultation       Cardiovascular hypertension, Pt. on medications and Pt. on home beta blockers Normal cardiovascular exam Rhythm:Regular Rate:Normal     Neuro/Psych negative neurological ROS  negative psych ROS   GI/Hepatic negative GI ROS, Neg liver ROS,   Endo/Other  negative endocrine ROS  Renal/GU negative Renal ROS  negative genitourinary   Musculoskeletal negative musculoskeletal ROS (+)   Abdominal (+)  Abdomen: soft. Bowel sounds: normal.  Peds negative pediatric ROS (+)  Hematology negative hematology ROS (+) anemia ,   Anesthesia Other Findings   Reproductive/Obstetrics negative OB ROS                          Anesthesia Physical Anesthesia Plan  ASA: III  Anesthesia Plan: General   Post-op Pain Management:    Induction: Intravenous  Airway Management Planned: Oral ETT  Additional Equipment: Arterial line  Intra-op Plan:   Post-operative Plan: Extubation in OR  Informed Consent: I have reviewed the patients History and Physical, chart, labs and discussed the procedure including the risks, benefits and alternatives for the proposed anesthesia with the patient or authorized representative who has indicated his/her understanding and acceptance.   Dental advisory given  Plan Discussed with: CRNA and Surgeon  Anesthesia Plan Comments:         Anesthesia Quick Evaluation

## 2015-07-26 NOTE — Progress Notes (Signed)
    Subjective:  Follow up thoracolumbar dural AVF branch embo today Feels well. Minimal c/o pain No neuro sxs.  Allergies: Fusion plus  Medications:  Current facility-administered medications:  .  0.9 %  sodium chloride infusion, , Intravenous, Continuous, Julieanne CottonSanjeev Deveshwar, MD .  acetaminophen (TYLENOL) tablet 1,000 mg, 1,000 mg, Oral, Q6H PRN **OR** acetaminophen (TYLENOL) suppository 650 mg, 650 mg, Rectal, Q6H PRN, Julieanne CottonSanjeev Deveshwar, MD .  ceFAZolin (ANCEF) 2-4 GM/100ML-% IVPB, , , ,  .  lactated ringers infusion, , Intravenous, Continuous, Cecile HearingStephen Edward Turk, MD, Last Rate: 10 mL/hr at 07/26/15 0737 .  nicardipine (CARDENE) 20mg  in 0.86% saline 200ml IV infusion (0.1 mg/ml), 5-15 mg/hr, Intravenous, Continuous, Sanjeev Deveshwar, MD .  nitroGLYCERIN 100 MCG/ML intra-arterial injection, , , ,  .  ondansetron (ZOFRAN) injection 4 mg, 4 mg, Intravenous, Q6H PRN, Julieanne CottonSanjeev Deveshwar, MD    Vital Signs: BP 136/74 mmHg  Pulse 70  Temp(Src) 97.7 F (36.5 C)  Resp 12  SpO2 98%  Physical Exam A&O x 3 Lungs: CTA Heart: regular Abd: soft, NT EXT: (R)groin soft, no hematoma Neuro: LE sensation intact and equal Strength 5/5 Proprioception normal.   Imaging: No results found.  Labs:  CBC:  Recent Labs  04/15/15 0639 05/14/15 1358 05/18/15 0342 07/20/15 1324  WBC 6.7 5.7 10.1 6.2  HGB 13.2 14.1 12.8* 14.3  HCT 39.9 40.9 38.1* 41.8  PLT 178 170 173 202    COAGS:  Recent Labs  04/15/15 0639 05/14/15 1358 07/20/15 1324  INR 1.08 1.05 1.09  APTT 34 35 34    BMP:  Recent Labs  04/15/15 0639 05/14/15 1358 05/18/15 0342 07/20/15 1324  NA 140 145 143 143  K 3.7 3.8 4.1 3.8  CL 107 108 106 108  CO2 24 27 25 25   GLUCOSE 112* 108* 124* 117*  BUN 13 9 10 11   CALCIUM 9.3 9.0 8.9 9.3  CREATININE 1.21 1.09 1.02 1.16  GFRNONAA 57* >60 >60 60*  GFRAA >60 >60 >60 >60    LIVER FUNCTION TESTS:  Recent Labs  04/15/15 0639 05/14/15 1358  07/20/15 1324  BILITOT 0.8 0.6 1.0  AST 27 32 31  ALT 16* 20 20  ALKPHOS 50 58 64  PROT 6.8 6.7 7.3  ALBUMIN 3.9 3.8 4.0    Assessment and Plan:  S/p Onyx embolization of lumbar branches to thoracolumbar dural AVF DC aline Liquids diet DC foley in am, OOB in am Hopefully home tomorrow.  Electronically Signed: Brayton Murphy, Jeremiah Spadafora 07/26/2015, 3:42 PM

## 2015-07-26 NOTE — H&P (Signed)
Chief Complaint: Patient was seen in consultation today for spinal dural AVF at the request of Deveshwar,Sanjeev  Referring Physician(s): Deveshwar,Sanjeev  Supervising Physician: Julieanne Cottoneveshwar, Sanjeev  History of Present Illness: Jeremiah Murphy is a 75 y.o. 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 His last procedure was 05/17/15 and went well, He has since seen Dr. Corliss Skainseveshwar for follow up and is scheduled for second stage embolization of additional branches. PMHx, meds, labs, imaging, allergies reviewed. Pr feels well, no recent illness, fevers, chills. Has been NPO this am    Past Medical History  Diagnosis Date  . HTN (hypertension)   . Dyslipidemia   . Normocytic anemia   . Irregular heart rate     "years ago"  . Pneumonia     "walking" pneumonia  . Vertigo   . Arthritis   . Coronary artery disease     Past Surgical History  Procedure Laterality Date  . Carpal tunnel release  1970    left  . Eye surgery Bilateral     cataract surgery   . Tonsillectomy    . Radiology with anesthesia N/A 04/15/2015    Procedure: SPINAL ANGIOGRAM WITH TREATMENT    (RADIOLOGY WITH ANESTHESIA);  Surgeon: Julieanne CottonSanjeev Deveshwar, MD;  [facility];  Service: Radiology;  Laterality: N/A;  . Radiology with anesthesia N/A 05/17/2015    Procedure: RADIOLOGY WITH ANESTHESIA;  Surgeon: Julieanne CottonSanjeev Deveshwar, MD;  Location: [facility] OR;  Service: Radiology;  Laterality: N/A;  . Cardiac catheterization  1999    "balloon" procedure; 11/25/02 [facility] (Dr. Madalyn RobWilliam Dang, [place]): 60% pLCX and 90% dLCX, RCA occlusion. Med Tx. EF > 55% by echo.     Allergies: Fusion plus  Medications: Prior to Admission medications   Medication Sig Start Date End Date Taking? Authorizing Provider  acetaminophen (TYLENOL) 500 MG tablet Take 500 mg by mouth at bedtime as needed for mild pain or headache.     Historical Provider, MD  aspirin (ECOTRIN LOW STRENGTH) 81 MG EC tablet  Take 81 mg by mouth every evening. Swallow whole.    Historical Provider, MD  aspirin EC 81 MG tablet Take 81 mg by mouth daily.    Historical Provider, MD  atenolol (TENORMIN) 25 MG tablet Take 25 mg by mouth daily.  05/12/13   Historical Provider, MD  cetirizine (ZYRTEC) 10 MG tablet Take 10 mg by mouth at bedtime as needed for allergies.     Historical Provider, MD  simvastatin (ZOCOR) 20 MG tablet Take 20 mg by mouth daily at 6 PM.  06/12/13   Historical Provider, MD  Tetrahydroz-Glyc-Hyprom-PEG (VISINE MAXIMUM REDNESS RELIEF) 0.05-0.2-0.36-1 % SOLN Place 1 drop into both eyes daily as needed (for allergy relief).    Historical Provider, MD     Family History  Problem Relation Age of Onset  . Breast cancer Mother   . Breast cancer Sister   . Breast cancer Sister   . Colon cancer Neg Hx     Social History   Social History  . Marital Status: Married    Spouse Name: N/A  . Number of Children: N/A  . Years of Education: N/A   Social History Main Topics  . Smoking status: Never Smoker   . Smokeless tobacco: Never Used  . Alcohol Use: No  . Drug Use: No  . Sexual Activity: Not on file   Other Topics Concern  . Not on file   Social History Narrative  Review of Systems: A 12 point ROS discussed and pertinent positives are indicated in the HPI above.  All other systems are negative.  Review of Systems  Vital Signs: BP 150/57 mmHg  Pulse 70  Temp(Src) 98 F (36.7 C) (Oral)  Resp 20  Ht  (1.727 m)  Wt 164 lb (74.39 kg)  BMI 24.94 kg/m2  SpO2 98%  Physical Exam  Constitutional: He is oriented to person, place, and time. He appears well-developed and well-nourished. No distress.  HENT:  Head: Normocephalic.  Mouth/Throat: Oropharynx is clear and moist.  Neck: No tracheal deviation present.  Cardiovascular: Normal rate, regular rhythm and normal heart sounds.   Pulmonary/Chest: Effort normal and breath sounds normal.  Neurological: He is alert and oriented to  person, place, and time. No cranial nerve deficit. Coordination normal.  Psychiatric: He has a normal mood and affect. Judgment normal.     Imaging: No results found.  Labs:  CBC:  Recent Labs  04/15/15 0639 05/14/15 1358 05/18/15 0342 07/20/15 1324  WBC 6.7 5.7 10.1 6.2  HGB 13.2 14.1 12.8* 14.3  HCT 39.9 40.9 38.1* 41.8  PLT 178 170 173 202    COAGS:  Recent Labs  04/15/15 0639 05/14/15 1358 07/20/15 1324  INR 1.08 1.05 1.09  APTT 34 35 34    BMP:  Recent Labs  04/15/15 0639 05/14/15 1358 05/18/15 0342 07/20/15 1324  NA 140 145 143 143  K 3.7 3.8 4.1 3.8  CL 107 108 106 108  CO2 GLUCOSE 112* 108* 124* 117*  BUN CALCIUM 9.3 9.0 8.9 9.3  CREATININE 1.21 1.09 1.02 1.16  GFRNONAA 57* >60 >60 60*  GFRAA >60 >60 >60 >60    LIVER FUNCTION TESTS:  Recent Labs  04/15/15 0639 05/14/15 1358 07/20/15 1324  BILITOT 0.8 0.6 1.0  AST 27 32 31  ALT 16* 20 20  ALKPHOS 50 58 64  PROT 6.8 6.7 7.3  ALBUMIN 3.9 3.8 4.0    Assessment and Plan: Thoracolumbar dural AVF For second stage embolization today Labs reviewed. Risks and Benefits discussed with the patient including, but not limited to bleeding, infection, vascular injury or contrast induced renal failure. All of the patient's questions were answered, patient is agreeable to proceed. Consent signed and in chart.   Thank you for this interesting consult.    A copy of this report was sent to the requesting provider on this date.  Electronically Signed: Brayton El 07/26/2015, 8:29 AM   I spent a total of 20 minutes in face to face in clinical consultation, greater than 50% of which was counseling/coordinating care for dural AVF embolization

## 2015-07-26 NOTE — Progress Notes (Signed)
Correction in charting: NTG intra-arterial given at 0950, 1003 and 1105; times were were incorrect on previous charting

## 2015-07-26 NOTE — Progress Notes (Signed)
NTG intra-arterial given

## 2015-07-26 NOTE — Progress Notes (Signed)
Foley 60F inserted- extra 10cc saline to baloon. No problems noted

## 2015-07-26 NOTE — Anesthesia Procedure Notes (Signed)
Procedure Name: Intubation Date/Time: 07/26/2015 8:59 AM Performed by: Dorie RankQUINN, Hitomi Slape M Pre-anesthesia Checklist: Patient identified, Emergency Drugs available, Suction available, Patient being monitored and Timeout performed Patient Re-evaluated:Patient Re-evaluated prior to inductionOxygen Delivery Method: Circle system utilized Preoxygenation: Pre-oxygenation with 100% oxygen Intubation Type: IV induction Ventilation: Mask ventilation without difficulty Laryngoscope Size: Mac and 3 Grade View: Grade I Tube type: Oral Tube size: 7.5 mm Number of attempts: 1 Airway Equipment and Method: Stylet Placement Confirmation: ETT inserted through vocal cords under direct vision,  positive ETCO2 and breath sounds checked- equal and bilateral Secured at: 22 cm Tube secured with: Tape Dental Injury: Teeth and Oropharynx as per pre-operative assessment

## 2015-07-26 NOTE — Progress Notes (Signed)
NTG intra-arterial 25mcg given 

## 2015-07-26 NOTE — Progress Notes (Signed)
NTG intra-arterial given

## 2015-07-26 NOTE — Procedures (Signed)
S/P spinal arteriogram followed by staged  embolization of Left L1  And RT L2 arteries supplying complex spinal dural fistula with liquid Onyx 18

## 2015-07-26 NOTE — Transfer of Care (Signed)
Immediate Anesthesia Transfer of Care Note  Patient: Jeremiah Murphy  Procedure(s) Performed: Procedure(s): EMBOLIZATION         (RADIOLOGY WITH ANESTHESIA) (N/A)  Patient Location: PACU  Anesthesia Type:General  Level of Consciousness: awake, alert  and oriented  Airway & Oxygen Therapy: Patient connected to face mask oxygen  Post-op Assessment: Report given to RN  Post vital signs: stable  Last Vitals: There were no vitals filed for this visit.  Complications: No apparent anesthesia complications

## 2015-07-26 NOTE — Anesthesia Postprocedure Evaluation (Signed)
Anesthesia Post Note  Patient: Jeremiah Murphy  Procedure(s) Performed: Procedure(s) (LRB): EMBOLIZATION         (RADIOLOGY WITH ANESTHESIA) (N/A)  Patient location during evaluation: PACU Anesthesia Type: General Level of consciousness: awake and alert Pain management: pain level controlled Vital Signs Assessment: post-procedure vital signs reviewed and stable Respiratory status: spontaneous breathing, nonlabored ventilation, respiratory function stable and patient connected to nasal cannula oxygen Cardiovascular status: blood pressure returned to baseline and stable Postop Assessment: no signs of nausea or vomiting Anesthetic complications: no    Last Vitals:  Filed Vitals:   07/26/15 1345 07/26/15 1415  BP: 136/73 136/74  Pulse: 70   Temp:    Resp: 12     Last Pain:  Filed Vitals:   07/26/15 1417  PainSc: 0-No pain                 Sangeeta Youse S

## 2015-07-26 NOTE — Progress Notes (Signed)
eLink Physician-Brief Progress Note Patient Name: Jeremiah Murphy DOB: [birth year] MRN: [id number]   Date of Service  07/26/2015  HPI/Events of Note    eICU Interventions  SCDs while in bed     Intervention Category Intermediate Interventions: Best-practice therapies (e.g. DVT, beta blocker, etc.)  Jeremiah Kimberlin V. 07/26/2015, 5:41 PM

## 2015-07-27 ENCOUNTER — Encounter (HOSPITAL_COMMUNITY): Payer: Self-pay | Admitting: Interventional Radiology

## 2015-07-27 LAB — CBC WITH DIFFERENTIAL/PLATELET
BASOS ABS: 0 10*3/uL (ref 0.0–0.1)
Basophils Relative: 0 %
Eosinophils Absolute: 0 10*3/uL (ref 0.0–0.7)
Eosinophils Relative: 0 %
HEMATOCRIT: 38.7 % — AB (ref 39.0–52.0)
HEMOGLOBIN: 13.1 g/dL (ref 13.0–17.0)
LYMPHS ABS: 0.6 10*3/uL — AB (ref 0.7–4.0)
LYMPHS PCT: 6 %
MCH: 32.3 pg (ref 26.0–34.0)
MCHC: 33.9 g/dL (ref 30.0–36.0)
MCV: 95.3 fL (ref 78.0–100.0)
Monocytes Absolute: 0.6 10*3/uL (ref 0.1–1.0)
Monocytes Relative: 6 %
NEUTROS ABS: 8.1 10*3/uL — AB (ref 1.7–7.7)
Neutrophils Relative %: 88 %
Platelets: 172 10*3/uL (ref 150–400)
RBC: 4.06 MIL/uL — AB (ref 4.22–5.81)
RDW: 12.9 % (ref 11.5–15.5)
WBC: 9.2 10*3/uL (ref 4.0–10.5)

## 2015-07-27 LAB — BASIC METABOLIC PANEL
ANION GAP: 10 (ref 5–15)
BUN: 12 mg/dL (ref 6–20)
CHLORIDE: 107 mmol/L (ref 101–111)
CO2: 24 mmol/L (ref 22–32)
Calcium: 8.8 mg/dL — ABNORMAL LOW (ref 8.9–10.3)
Creatinine, Ser: 1.12 mg/dL (ref 0.61–1.24)
GFR calc Af Amer: >60 mL/min (ref >60)
Glucose, Bld: 131 mg/dL — ABNORMAL HIGH (ref 65–99)
POTASSIUM: 4.1 mmol/L (ref 3.5–5.1)
SODIUM: 141 mmol/L (ref 135–145)

## 2015-07-27 NOTE — Progress Notes (Signed)
Explained discharge paperwork with patient. Understood all instructions and had no further questions.  Awaiting his wife to pick him up.

## 2015-07-27 NOTE — Discharge Summary (Signed)
   Patient ID: Jeremiah Murphy MRN: [id number] DOB/AGE: 29-Jun-1940 75 y.o.  Admit date: 07/26/2015 Discharge date: 07/27/2015  Supervising Physician: Jeremiah Murphy, Jeremiah Murphy  Admission Diagnoses: spinal dural AVF   Discharge Diagnoses:  Active Problems:   Arteriovenous fistula of spinal cord vessels   Discharged Condition: good  Hospital Course: The patient was admitted and underwent spinal arteriogram followed by staged embolization of Left L1 And RT L2 arteries supplying complex spinal dural fistula with liquid Onyx 18.  He went to the neuro ICU post op.  He did very well.  His foley was removed on POD 1.  He was otherwise tolerating a diet with no other issues.  He was stable for dc home.  Consults: None  Treatments: IV hydration  Discharge Exam: Blood pressure 125/67, pulse 87, temperature 98.2 F (36.8 C), temperature source Oral, resp. rate 18, SpO2 98 %. General appearance: alert and cooperative Resp: clear to auscultation bilaterally Cardio: regular rate and rhythm, S1, S2 normal, no murmur, click, rub or gallop Neurologic: Cranial nerves: normal  Disposition: 01-Home or Self Care     Medication List    TAKE these medications        acetaminophen 500 MG tablet  Commonly known as:  TYLENOL  Take 500 mg by mouth at bedtime as needed for mild pain or headache.     aspirin EC 81 MG tablet  Take 81 mg by mouth daily.     ECOTRIN LOW STRENGTH 81 MG EC tablet  Generic drug:  aspirin  Take 81 mg by mouth every evening. Swallow whole.     atenolol 25 MG tablet  Commonly known as:  TENORMIN  Take 25 mg by mouth daily.     cetirizine 10 MG tablet  Commonly known as:  ZYRTEC  Take 10 mg by mouth at bedtime as needed for allergies.     simvastatin 20 MG tablet  Commonly known as:  ZOCOR  Take 20 mg by mouth daily at 6 PM.     VISINE MAXIMUM REDNESS RELIEF 0.05-0.2-0.36-1 % Soln  Generic drug:  Tetrahydroz-Glyc-Hyprom-PEG  Place 1 drop into both eyes daily as  needed (for allergy relief).           Follow-up Information    Follow up with Jeremiah Murphy, Jeremiah SilosSANJEEV K, MD In 2 weeks.   Specialty:  Interventional Radiology   Why:  [name] should call you   Contact information:   [address] [place] [address] [phone]        Electronically Signed: Letha CapeSBORNE,Wally Shevchenko E 07/27/2015, 10:24 AM   I have spent Greater Than 30 Minutes discharging Jeremiah Murphy.

## 2015-07-27 NOTE — Care Management Note (Signed)
Case Management Note  Patient Details  Name: Jeremiah Murphy MRN: [id number] Date of Birth: [birth year]  Subjective/Objective:  Pt admitted on 07/26/15 s/p thoracolumbar dural AVF branch embolization.  PTA, pt independent of ADLS, lives with spouse.                    Action/Plan: Met with pt to discuss dc needs.  Pt denies any home needs; states wife will provide care at dc.    Expected Discharge Date:   07/27/15               Expected Discharge Plan:  Home/Self Care  In-House Referral:     Discharge planning Services  CM Consult  Post Acute Care Choice:    Choice offered to:     DME Arranged:    DME Agency:     HH Arranged:    HH Agency:     Status of Service:  Completed, signed off  Medicare Important Message Given:    Date Medicare IM Given:    Medicare IM give by:    Date Additional Medicare IM Given:    Additional Medicare Important Message give by:     If discussed at [place] of Stay Meetings, dates discussed:    Additional Comments:  Reinaldo Raddle, RN, BSN  Trauma/Neuro ICU Case Manager [phone]

## 2015-07-29 ENCOUNTER — Other Ambulatory Visit (HOSPITAL_COMMUNITY): Payer: Self-pay | Admitting: Interventional Radiology

## 2015-07-29 DIAGNOSIS — Q288 Other specified congenital malformations of circulatory system: Secondary | ICD-10-CM

## 2015-08-13 ENCOUNTER — Ambulatory Visit (HOSPITAL_COMMUNITY): Payer: Commercial Managed Care - HMO

## 2015-09-01 ENCOUNTER — Ambulatory Visit (HOSPITAL_COMMUNITY)
Admission: RE | Admit: 2015-09-01 | Discharge: 2015-09-01 | Disposition: A | Discharge status: Home / Self Care | Payer: Commercial Managed Care - HMO | Source: Ambulatory Visit | Attending: Interventional Radiology | Admitting: Interventional Radiology

## 2015-09-01 DIAGNOSIS — Q288 Other specified congenital malformations of circulatory system: Secondary | ICD-10-CM

## 2015-09-01 DIAGNOSIS — R202 Paresthesia of skin: Secondary | ICD-10-CM | POA: Diagnosis not present

## 2015-09-01 DIAGNOSIS — M79609 Pain in unspecified limb: Secondary | ICD-10-CM | POA: Diagnosis not present

## 2015-09-07 ENCOUNTER — Other Ambulatory Visit (HOSPITAL_COMMUNITY): Payer: Self-pay | Admitting: Interventional Radiology

## 2015-09-07 DIAGNOSIS — M792 Neuralgia and neuritis, unspecified: Secondary | ICD-10-CM

## 2015-09-07 DIAGNOSIS — Q288 Other specified congenital malformations of circulatory system: Secondary | ICD-10-CM

## 2015-09-16 ENCOUNTER — Ambulatory Visit (HOSPITAL_COMMUNITY)
Admission: RE | Admit: 2015-09-16 | Discharge: 2015-09-16 | Disposition: A | Discharge status: Home / Self Care | Payer: Commercial Managed Care - HMO | Source: Ambulatory Visit | Attending: Interventional Radiology | Admitting: Interventional Radiology

## 2015-09-16 DIAGNOSIS — M792 Neuralgia and neuritis, unspecified: Secondary | ICD-10-CM

## 2015-09-16 DIAGNOSIS — M5126 Other intervertebral disc displacement, lumbar region: Secondary | ICD-10-CM | POA: Diagnosis not present

## 2015-09-16 DIAGNOSIS — Q288 Other specified congenital malformations of circulatory system: Secondary | ICD-10-CM | POA: Diagnosis not present

## 2015-09-16 DIAGNOSIS — G5790 Unspecified mononeuropathy of unspecified lower limb: Secondary | ICD-10-CM | POA: Diagnosis not present

## 2015-09-16 DIAGNOSIS — G9519 Other vascular myelopathies: Secondary | ICD-10-CM | POA: Diagnosis not present

## 2015-09-16 DIAGNOSIS — M4804 Spinal stenosis, thoracic region: Secondary | ICD-10-CM | POA: Diagnosis not present

## 2015-09-16 MED ORDER — GADOBENATE DIMEGLUMINE 529 MG/ML IV SOLN
15.0000 mL | Freq: Once | INTRAVENOUS | Status: AC | PRN
  Administered 2015-09-16: 15 mL via INTRAVENOUS

## 2015-10-07 ENCOUNTER — Telehealth (HOSPITAL_COMMUNITY): Payer: Self-pay

## 2015-10-07 NOTE — Telephone Encounter (Signed)
Pt agreed to f/u in the clinic in 3 months. AW

## 2015-11-16 ENCOUNTER — Other Ambulatory Visit (HOSPITAL_COMMUNITY): Payer: Self-pay | Admitting: Interventional Radiology

## 2015-11-16 DIAGNOSIS — Q288 Other specified congenital malformations of circulatory system: Secondary | ICD-10-CM

## 2015-12-01 ENCOUNTER — Ambulatory Visit (HOSPITAL_COMMUNITY): Admission: RE | Admit: 2015-12-01 | Payer: Commercial Managed Care - HMO | Source: Ambulatory Visit

## 2015-12-09 ENCOUNTER — Ambulatory Visit (HOSPITAL_COMMUNITY): Admission: RE | Admit: 2015-12-09 | Payer: Commercial Managed Care - HMO | Source: Ambulatory Visit

## 2015-12-20 ENCOUNTER — Ambulatory Visit (HOSPITAL_COMMUNITY)
Admission: RE | Admit: 2015-12-20 | Discharge: 2015-12-20 | Disposition: A | Discharge status: Home / Self Care | Payer: Commercial Managed Care - HMO | Source: Ambulatory Visit | Attending: Interventional Radiology | Admitting: Interventional Radiology

## 2015-12-20 DIAGNOSIS — Q288 Other specified congenital malformations of circulatory system: Secondary | ICD-10-CM

## 2015-12-20 DIAGNOSIS — E782 Mixed hyperlipidemia: Secondary | ICD-10-CM | POA: Diagnosis not present

## 2015-12-20 DIAGNOSIS — D509 Iron deficiency anemia, unspecified: Secondary | ICD-10-CM | POA: Diagnosis not present

## 2015-12-20 DIAGNOSIS — R202 Paresthesia of skin: Secondary | ICD-10-CM | POA: Diagnosis not present

## 2015-12-20 HISTORY — PX: IR GENERIC HISTORICAL: IMG1180011

## 2015-12-21 ENCOUNTER — Encounter (HOSPITAL_COMMUNITY): Payer: Self-pay | Admitting: Interventional Radiology

## 2015-12-22 DIAGNOSIS — D509 Iron deficiency anemia, unspecified: Secondary | ICD-10-CM | POA: Diagnosis not present

## 2015-12-22 DIAGNOSIS — Z23 Encounter for immunization: Secondary | ICD-10-CM | POA: Diagnosis not present

## 2015-12-22 DIAGNOSIS — I10 Essential (primary) hypertension: Secondary | ICD-10-CM | POA: Diagnosis not present

## 2015-12-22 DIAGNOSIS — E782 Mixed hyperlipidemia: Secondary | ICD-10-CM | POA: Diagnosis not present

## 2016-01-03 DIAGNOSIS — E782 Mixed hyperlipidemia: Secondary | ICD-10-CM | POA: Diagnosis not present

## 2016-01-03 DIAGNOSIS — I10 Essential (primary) hypertension: Secondary | ICD-10-CM | POA: Diagnosis not present

## 2016-01-03 DIAGNOSIS — D509 Iron deficiency anemia, unspecified: Secondary | ICD-10-CM | POA: Diagnosis not present

## 2016-01-03 DIAGNOSIS — Z23 Encounter for immunization: Secondary | ICD-10-CM | POA: Diagnosis not present

## 2016-01-13 DIAGNOSIS — H905 Unspecified sensorineural hearing loss: Secondary | ICD-10-CM | POA: Diagnosis not present

## 2016-02-01 DIAGNOSIS — Z23 Encounter for immunization: Secondary | ICD-10-CM | POA: Diagnosis not present

## 2016-02-03 DIAGNOSIS — H2513 Age-related nuclear cataract, bilateral: Secondary | ICD-10-CM | POA: Diagnosis not present

## 2016-02-03 DIAGNOSIS — H5210 Myopia, unspecified eye: Secondary | ICD-10-CM | POA: Diagnosis not present

## 2016-04-06 ENCOUNTER — Other Ambulatory Visit (HOSPITAL_COMMUNITY): Payer: Self-pay | Admitting: Interventional Radiology

## 2016-04-06 DIAGNOSIS — Q288 Other specified congenital malformations of circulatory system: Secondary | ICD-10-CM

## 2016-04-21 DIAGNOSIS — H6123 Impacted cerumen, bilateral: Secondary | ICD-10-CM | POA: Diagnosis not present

## 2016-04-21 DIAGNOSIS — Z6821 Body mass index (BMI) 21.0-21.9, adult: Secondary | ICD-10-CM | POA: Diagnosis not present

## 2016-04-26 ENCOUNTER — Ambulatory Visit (HOSPITAL_COMMUNITY): Payer: Commercial Managed Care - HMO

## 2016-04-26 ENCOUNTER — Ambulatory Visit (HOSPITAL_COMMUNITY): Admission: RE | Admit: 2016-04-26 | Payer: Commercial Managed Care - HMO | Source: Ambulatory Visit

## 2016-04-26 ENCOUNTER — Encounter (HOSPITAL_COMMUNITY): Payer: Self-pay

## 2016-05-03 ENCOUNTER — Ambulatory Visit (HOSPITAL_COMMUNITY)
Admission: RE | Admit: 2016-05-03 | Discharge: 2016-05-03 | Disposition: A | Discharge status: Home / Self Care | Payer: Commercial Managed Care - HMO | Source: Ambulatory Visit | Attending: Interventional Radiology | Admitting: Interventional Radiology

## 2016-05-03 ENCOUNTER — Other Ambulatory Visit (HOSPITAL_COMMUNITY): Payer: Commercial Managed Care - HMO

## 2016-05-03 DIAGNOSIS — M50323 Other cervical disc degeneration at C6-C7 level: Secondary | ICD-10-CM | POA: Diagnosis not present

## 2016-05-03 DIAGNOSIS — Q288 Other specified congenital malformations of circulatory system: Secondary | ICD-10-CM

## 2016-05-03 DIAGNOSIS — M5136 Other intervertebral disc degeneration, lumbar region: Secondary | ICD-10-CM | POA: Diagnosis not present

## 2016-05-03 DIAGNOSIS — M542 Cervicalgia: Secondary | ICD-10-CM | POA: Insufficient documentation

## 2016-05-03 DIAGNOSIS — M48061 Spinal stenosis, lumbar region without neurogenic claudication: Secondary | ICD-10-CM | POA: Insufficient documentation

## 2016-05-03 DIAGNOSIS — M5134 Other intervertebral disc degeneration, thoracic region: Secondary | ICD-10-CM | POA: Diagnosis not present

## 2016-05-03 DIAGNOSIS — M5126 Other intervertebral disc displacement, lumbar region: Secondary | ICD-10-CM | POA: Insufficient documentation

## 2016-05-03 DIAGNOSIS — G9519 Other vascular myelopathies: Secondary | ICD-10-CM | POA: Diagnosis not present

## 2016-05-03 DIAGNOSIS — M545 Low back pain: Secondary | ICD-10-CM | POA: Insufficient documentation

## 2016-05-03 DIAGNOSIS — R200 Anesthesia of skin: Secondary | ICD-10-CM | POA: Diagnosis not present

## 2016-05-03 DIAGNOSIS — M5021 Other cervical disc displacement,  high cervical region: Secondary | ICD-10-CM | POA: Diagnosis not present

## 2016-05-03 LAB — POCT I-STAT CREATININE: CREATININE: 1 mg/dL (ref 0.61–1.24)

## 2016-05-03 MED ORDER — GADOBENATE DIMEGLUMINE 529 MG/ML IV SOLN
15.0000 mL | Freq: Once | INTRAVENOUS | Status: AC | PRN
  Administered 2016-05-03: 15 mL via INTRAVENOUS

## 2016-05-05 ENCOUNTER — Ambulatory Visit (HOSPITAL_COMMUNITY): Payer: Commercial Managed Care - HMO

## 2016-05-16 ENCOUNTER — Ambulatory Visit (HOSPITAL_COMMUNITY): Payer: Commercial Managed Care - HMO

## 2016-05-22 ENCOUNTER — Other Ambulatory Visit (HOSPITAL_COMMUNITY): Payer: Self-pay | Admitting: Interventional Radiology

## 2016-05-22 DIAGNOSIS — Q288 Other specified congenital malformations of circulatory system: Secondary | ICD-10-CM

## 2016-05-29 ENCOUNTER — Ambulatory Visit (HOSPITAL_COMMUNITY): Payer: Commercial Managed Care - HMO

## 2016-06-19 ENCOUNTER — Ambulatory Visit (HOSPITAL_COMMUNITY): Payer: Commercial Managed Care - HMO

## 2016-06-26 DIAGNOSIS — D509 Iron deficiency anemia, unspecified: Secondary | ICD-10-CM | POA: Diagnosis not present

## 2016-06-26 DIAGNOSIS — I10 Essential (primary) hypertension: Secondary | ICD-10-CM | POA: Diagnosis not present

## 2016-06-28 DIAGNOSIS — I10 Essential (primary) hypertension: Secondary | ICD-10-CM | POA: Diagnosis not present

## 2016-06-28 DIAGNOSIS — D509 Iron deficiency anemia, unspecified: Secondary | ICD-10-CM | POA: Diagnosis not present

## 2016-06-28 DIAGNOSIS — Z6824 Body mass index (BMI) 24.0-24.9, adult: Secondary | ICD-10-CM | POA: Diagnosis not present

## 2016-06-28 DIAGNOSIS — E782 Mixed hyperlipidemia: Secondary | ICD-10-CM | POA: Diagnosis not present

## 2016-06-30 ENCOUNTER — Telehealth (HOSPITAL_COMMUNITY): Payer: Self-pay | Admitting: Radiology

## 2016-06-30 ENCOUNTER — Ambulatory Visit (HOSPITAL_COMMUNITY): Admission: RE | Admit: 2016-06-30 | Payer: Commercial Managed Care - HMO | Source: Ambulatory Visit

## 2016-06-30 NOTE — Telephone Encounter (Signed)
Called both pt and wife and left VM on both phones that we are canceling Mr. Jeremiah Murphy's appt today and to call me back to reschedule. JM

## 2016-07-19 ENCOUNTER — Ambulatory Visit (HOSPITAL_COMMUNITY): Payer: Commercial Managed Care - HMO

## 2016-07-21 ENCOUNTER — Ambulatory Visit (HOSPITAL_COMMUNITY): Admission: RE | Admit: 2016-07-21 | Payer: Commercial Managed Care - HMO | Source: Ambulatory Visit

## 2016-07-28 ENCOUNTER — Ambulatory Visit (HOSPITAL_COMMUNITY)
Admission: RE | Admit: 2016-07-28 | Discharge: 2016-07-28 | Disposition: A | Discharge status: Home / Self Care | Payer: Commercial Managed Care - HMO | Source: Ambulatory Visit | Attending: Interventional Radiology | Admitting: Interventional Radiology

## 2016-07-28 DIAGNOSIS — Q288 Other specified congenital malformations of circulatory system: Secondary | ICD-10-CM

## 2016-07-28 DIAGNOSIS — R202 Paresthesia of skin: Secondary | ICD-10-CM | POA: Diagnosis not present

## 2016-07-28 HISTORY — PX: IR RADIOLOGIST EVAL & MGMT: IMG5224

## 2016-07-31 ENCOUNTER — Encounter: Payer: Self-pay | Admitting: Neurology

## 2016-07-31 ENCOUNTER — Encounter (HOSPITAL_COMMUNITY): Payer: Self-pay | Admitting: Interventional Radiology

## 2016-09-25 ENCOUNTER — Encounter: Payer: Self-pay | Admitting: Neurology

## 2016-10-06 ENCOUNTER — Ambulatory Visit: Payer: Commercial Managed Care - HMO | Admitting: Neurology

## 2016-10-23 ENCOUNTER — Telehealth (HOSPITAL_COMMUNITY): Payer: Self-pay

## 2016-10-23 NOTE — Telephone Encounter (Signed)
Called to schedule f/u mri, left message for pt to return call. AW 

## 2016-11-28 DIAGNOSIS — H608X9 Other otitis externa, unspecified ear: Secondary | ICD-10-CM | POA: Diagnosis not present

## 2016-11-28 DIAGNOSIS — H6120 Impacted cerumen, unspecified ear: Secondary | ICD-10-CM | POA: Diagnosis not present

## 2016-12-27 ENCOUNTER — Other Ambulatory Visit: Payer: Self-pay | Admitting: Neurology

## 2016-12-27 ENCOUNTER — Other Ambulatory Visit: Payer: Self-pay | Admitting: *Deleted

## 2016-12-27 ENCOUNTER — Encounter: Payer: Self-pay | Admitting: Neurology

## 2016-12-27 ENCOUNTER — Other Ambulatory Visit (INDEPENDENT_AMBULATORY_CARE_PROVIDER_SITE_OTHER): Payer: Medicare HMO

## 2016-12-27 ENCOUNTER — Ambulatory Visit (INDEPENDENT_AMBULATORY_CARE_PROVIDER_SITE_OTHER): Payer: Medicare HMO | Admitting: Neurology

## 2016-12-27 VITALS — BP 140/84 | HR 81 | Ht 68.0 in | Wt 162.1 lb

## 2016-12-27 DIAGNOSIS — Z131 Encounter for screening for diabetes mellitus: Secondary | ICD-10-CM

## 2016-12-27 DIAGNOSIS — G629 Polyneuropathy, unspecified: Secondary | ICD-10-CM

## 2016-12-27 LAB — HEMOGLOBIN A1C: HEMOGLOBIN A1C: 5.8 % (ref 4.6–6.5)

## 2016-12-27 LAB — VITAMIN B12: Vitamin B-12: 430 pg/mL (ref 211–911)

## 2016-12-27 LAB — TSH: TSH: 1.5 u[IU]/mL (ref 0.35–4.50)

## 2016-12-27 MED ORDER — GABAPENTIN 300 MG PO CAPS: 300.0000 mg | ORAL_CAPSULE | Freq: Every day | ORAL | 11 refills | Status: DC

## 2016-12-27 NOTE — Progress Notes (Signed)
[facility] Clinic Note - Initial Visit   Date: 12/27/16  JAQUA CHING MRN: [id number] DOB: 1940/08/22   Dear Dr. Corliss Skains:  Thank you for your kind referral of Jeremiah Murphy for consultation of paresthesias. Although his history is well known to you, please allow [place] to reiterate it for the purpose of our medical record. The patient was accompanied to the clinic by wife who also provides collateral information.     History of Present Illness: Jeremiah Murphy is a 76 y.o. right-handed [place] male with thoracolumbar spinal dural AVF s/p embolization, hyperlipidemia, and hypertension presenting for evaluation of paresthesias of the feet.    He was diagnosed with lumbar spinal dural AVF in 2016 and is followed by Dr. Corliss Skains who performed embolization in 2017.  Prior to this, he reports numbness and tingling of both feet for several years.  Symptoms are constant and make him feel as if his feet is in sand.  Symptoms became more noticeable following his embolization in 2017.  He had not identified any exacerbating or alleviating factors. She endorses imbalance and walks unassisted.  He has not suffered any falls.  He does not have any weakness of the legs.  He denies diabetes, alcohol use, or family history of neuropathy.   Out-side paper records, electronic medical record, and images have been reviewed where available and summarized as:  MRI lumbar spine wwo contrast 05/03/2016: 1. Unchanged small focus of chronic hemorrhage in the spinal cord at L1 and mild distal spinal cord edema. Unchanged appearance of prominent epidural and paravertebral vessels in the lower thoracic and upper lumbar spine related to the known AVM. 2. Partial regression of L2-3 disc herniation with decreased left lateral recess stenosis. 3. Unchanged disc and facet degeneration elsewhere.  Past Medical History:  Diagnosis Date  . Arthritis   . Coronary artery disease   .  Dyslipidemia   . HTN (hypertension)   . Irregular heart rate    "years ago"  . Normocytic anemia   . Pneumonia    "walking" pneumonia  . Vertigo     Past Surgical History:  Procedure Laterality Date  . CARDIAC CATHETERIZATION  1999   "balloon" procedure; 11/25/02 [facility] (Dr. Madalyn Rob, [place]): 60% pLCX and 90% dLCX, RCA occlusion. Med Tx. EF > 55% by echo.   . CARPAL TUNNEL RELEASE  1970   left  . EYE SURGERY Bilateral    cataract surgery   . IR GENERIC HISTORICAL  12/20/2015   IR RADIOLOGIST EVAL & MGMT 12/20/2015 [facility]INTER[facility] RAD  . IR RADIOLOGIST EVAL & MGMT  07/28/2016  . RADIOLOGY WITH ANESTHESIA N/A 04/15/2015   Procedure: SPINAL ANGIOGRAM WITH TREATMENT    (RADIOLOGY WITH ANESTHESIA);  Surgeon: Julieanne Cotton, MD;  Location: [facility] OR;  Service: Radiology;  Laterality: N/A;  . RADIOLOGY WITH ANESTHESIA N/A 05/17/2015   Procedure: RADIOLOGY WITH ANESTHESIA;  Surgeon: Julieanne Cotton, MD;  Location: [facility] OR;  Service: Radiology;  Laterality: N/A;  . RADIOLOGY WITH ANESTHESIA N/A 07/26/2015   Procedure: EMBOLIZATION         (RADIOLOGY WITH ANESTHESIA);  Surgeon: Julieanne Cotton, MD;  Location: [facility] OR;  Service: Radiology;  Laterality: N/A;  . TONSILLECTOMY       Medications:  Outpatient Encounter Prescriptions as of 12/27/2016  Medication Sig  . acetaminophen (TYLENOL) 500 MG tablet Take 500 mg by mouth at bedtime as needed for mild pain or headache.   Marland Kitchen aspirin (ECOTRIN LOW STRENGTH) 81 MG EC tablet Take  81 mg by mouth every evening. Swallow whole.  Marland Kitchen atenolol (TENORMIN) 25 MG tablet Take 25 mg by mouth daily.   . cetirizine (ZYRTEC) 10 MG tablet Take 10 mg by mouth at bedtime as needed for allergies.   . simvastatin (ZOCOR) 20 MG tablet Take 20 mg by mouth daily at 6 PM.   . Tetrahydroz-Glyc-Hyprom-PEG (VISINE MAXIMUM REDNESS RELIEF) 0.05-0.2-0.36-1 % SOLN Place 1 drop into both eyes daily as needed (for allergy relief).  . gabapentin (NEURONTIN) 300 MG capsule Take 1 capsule (300 mg  total) by mouth at bedtime.  . [DISCONTINUED] aspirin EC 81 MG tablet Take 81 mg by mouth daily.   No facility-administered encounter medications on file as of 12/27/2016.      Allergies:  Allergies  Allergen Reactions  . Fusion Plus [Iron-Fa-B Cmp-C-Biot-Probiotic] Other (See Comments)    constipation    Family History: Family History  Problem Relation Age of Onset  . Breast cancer Mother   . Breast cancer Sister   . Breast cancer Sister   . Other Father        11, died from fall  . Colon cancer Neg Hx     Social History: Social History  Substance Use Topics  . Smoking status: Never Smoker  . Smokeless tobacco: Never Used  . Alcohol use No   Social History   Social History Narrative   Lives with wife in a one story home.  Has 2 children.  Retired Company secretary.  Education: high school.     Review of Systems:  CONSTITUTIONAL: No fevers, chills, night sweats, or weight loss.   EYES: No visual changes or eye pain ENT: No hearing changes.  No history of nose bleeds.   RESPIRATORY: No cough, wheezing and shortness of breath.   CARDIOVASCULAR: Negative for chest pain, and palpitations.   GI: Negative for abdominal discomfort, blood in stools or black stools.  No recent change in bowel habits.   GU:  No history of incontinence.   MUSCLOSKELETAL: No history of joint pain or swelling.  No myalgias.   SKIN: Negative for lesions, rash, and itching.   HEMATOLOGY/ONCOLOGY: Negative for prolonged bleeding, bruising easily, and swollen nodes.  No history of cancer.   ENDOCRINE: Negative for cold or heat intolerance, polydipsia or goiter.   PSYCH:  No depression or anxiety symptoms.   NEURO: As Above.   Vital Signs:  BP 140/84   Pulse 81   Ht 5\' 8"  (1.727 m)   Wt 162 lb 1 oz (73.5 kg)   SpO2 96%   BMI 24.64 kg/m    General Medical Exam:   General:  Well appearing, comfortable.   Eyes/ENT: see cranial nerve examination.   Neck: No masses appreciated.  Full range of  motion without tenderness.  No carotid bruits. Respiratory:  Clear to auscultation, good air entry bilaterally.   Cardiac:  Regular rate and rhythm, no murmur.   Extremities:  No deformities, edema, or skin discoloration.  Skin:  No rashes or lesions.  Neurological Exam: MENTAL STATUS including orientation to time, place, person, recent and remote memory, attention span and concentration, language, and fund of knowledge is fair.  There is language barrier and his hearing is impaired.  CRANIAL NERVES: II:  No visual field defects.  Unremarkable fundi.   III-IV-VI: Pupils equal round and reactive to light.  Normal conjugate, extra-ocular eye movements in all directions of gaze.  No nystagmus.  No ptosis.   V:  Normal facial sensation.  VII:  Normal facial symmetry and movements VIII:  Reduced hearing to finger rub bilaterally.   IX-X:  Normal palatal movement.   XI:  Normal shoulder shrug and head rotation.   XII:  Normal tongue strength and range of motion, no deviation or fasciculation.  MOTOR:  No atrophy, fasciculations or abnormal movements.  No pronator drift.  Tone is normal.    Right Upper Extremity:    Left Upper Extremity:    Deltoid  5/5   Deltoid  5/5   Biceps  5/5   Biceps  5/5   Triceps  5/5   Triceps  5/5   Wrist extensors  5/5   Wrist extensors  5/5   Wrist flexors  5/5   Wrist flexors  5/5   Finger extensors  5/5   Finger extensors  5/5   Finger flexors  5/5   Finger flexors  5/5   Dorsal interossei  5/5   Dorsal interossei  5/5   Abductor pollicis  5/5   Abductor pollicis  5/5   Tone (Ashworth scale)  0  Tone (Ashworth scale)  0   Right Lower Extremity:    Left Lower Extremity:    Hip flexors  5/5   Hip flexors  5/5   Hip extensors  5/5   Hip extensors  5/5   Knee flexors  5/5   Knee flexors  5/5   Knee extensors  5/5   Knee extensors  5/5   Dorsiflexors  5/5   Dorsiflexors  5/5   Plantarflexors  5/5   Plantarflexors  5/5   Toe extensors  5/5   Toe extensors   5/5   Toe flexors  5/5   Toe flexors  5/5   Tone (Ashworth scale)  0  Tone (Ashworth scale)  0   MSRs:  Right                                                                 Left brachioradialis 2+  brachioradialis 2+  biceps 2+  biceps 2+  triceps 2+  triceps 2+  patellar 2+  patellar 2+  ankle jerk 2+  ankle jerk 2+  Hoffman no  Hoffman no  plantar response down  plantar response down   SENSORY:  Vibration is diminished distal to ankles bilaterally; pin prick, temperature, and light touch is intact throughout.  Mild sway with Rhomberg testing.  COORDINATION/GAIT: Normal finger-to- nose-finger.  Intact rapid alternating movements bilaterally.  Able to rise from a chair without using arms.  Gait narrow based and stable. Tandem and stressed gait intact, there is mild unsteadiness with tandem gait   IMPRESSION: Mr. Biello is a 76 year-old man referred for evaluation of bilateral feet paresthesias.  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 Reflexes are intact in the legs which usually are diminished in neuropathy and this may be due to his known history of spinal dural AVF. I had extensive discussion with the patient regarding the pathogenesis, etiology, management, and natural course of neuropathy. Neuropathy tends to be slowly progressive, especially if a treatable etiology is not identified.  I would like to test for treatable causes of neuropathy. I discussed that in the vast majority of cases, despite checking for reversible causes, we  are unable to find the underlying etiology and management is symptomatic.  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   It is unlikely that his paresthesias are stemming from his thoracolumbar spinal dural AVF as I would expect more generalized paresthesias of the legs and not a length-dependent process. I have personally viewed his MRI  lumbar spine from January 2018 and agree with findings. NCS/EMG of the legs will be performed to localize symptoms.    PLAN/RECOMMENDATIONS:  1.  Check HbA1c, TSH, vitamin B12, copper, SPEP with IFE 2.  NCS/EMG of the legs 3.  Start gabapentin 300mg  at bedtime.  Uptitrate as tolerable  Return to clinic in 6 months.   Thank you for allowing me to participate in patient's care.  If I can answer any additional questions, I would be pleased to do so.    Sincerely,    Tzvi Economou K. Allena KatzPatel, DO

## 2016-12-27 NOTE — Patient Instructions (Addendum)
NCS/EMG of the legs  Check labs  Start gabapentin 300mg  at bedtime  We will call you with the results of your testing  Return to clinic in 6 months

## 2017-01-01 ENCOUNTER — Telehealth: Payer: Self-pay | Admitting: *Deleted

## 2017-01-01 LAB — PROTEIN ELECTROPHORESIS, SERUM
ALBUMIN ELP: 3.9 g/dL (ref 3.8–4.8)
ALBUMIN ELP: 4 g/dL (ref 3.8–4.8)
ALPHA 2: 0.7 g/dL (ref 0.5–0.9)
Alpha 1: 0.3 g/dL (ref 0.2–0.3)
Alpha 1: 0.3 g/dL (ref 0.2–0.3)
Alpha 2: 0.7 g/dL (ref 0.5–0.9)
BETA GLOBULIN: 0.4 g/dL (ref 0.4–0.6)
BETA GLOBULIN: 0.4 g/dL (ref 0.4–0.6)
Beta 2: 0.3 g/dL (ref 0.2–0.5)
Beta 2: 0.3 g/dL (ref 0.2–0.5)
Gamma Globulin: 1.3 g/dL (ref 0.8–1.7)
Gamma Globulin: 1.2 g/dL (ref 0.8–1.7)
TOTAL PROTEIN: 6.9 g/dL (ref 6.1–8.1)
TOTAL PROTEIN: 6.9 g/dL (ref 6.1–8.1)

## 2017-01-01 LAB — IMMUNOFIXATION ELECTROPHORESIS
IGG (IMMUNOGLOBIN G), SERUM: 1334 mg/dL (ref 694–1618)
IGM, SERUM: 49 mg/dL (ref 48–271)
Immunofix Electr Int: NOT DETECTED
Immunoglobulin A: 248 mg/dL (ref 81–463)

## 2017-01-01 LAB — COPPER, SERUM: COPPER: 105 ug/dL (ref 70–175)

## 2017-01-01 NOTE — Telephone Encounter (Signed)
-----   Message from [place] Chardonika K Patel, DO sent at 01/01/2017  8:09 AM EDT ----- Please notify patient lab are within normal limits.  Thank you.

## 2017-01-01 NOTE — Telephone Encounter (Signed)
Left message informing patient that his lab results are normal.

## 2017-01-12 DIAGNOSIS — I10 Essential (primary) hypertension: Secondary | ICD-10-CM | POA: Diagnosis not present

## 2017-01-12 DIAGNOSIS — D509 Iron deficiency anemia, unspecified: Secondary | ICD-10-CM | POA: Diagnosis not present

## 2017-01-16 ENCOUNTER — Encounter: Payer: Medicare HMO | Admitting: Neurology

## 2017-01-16 DIAGNOSIS — Z23 Encounter for immunization: Secondary | ICD-10-CM | POA: Diagnosis not present

## 2017-01-16 DIAGNOSIS — G9009 Other idiopathic peripheral autonomic neuropathy: Secondary | ICD-10-CM | POA: Diagnosis not present

## 2017-01-16 DIAGNOSIS — D509 Iron deficiency anemia, unspecified: Secondary | ICD-10-CM | POA: Diagnosis not present

## 2017-01-16 DIAGNOSIS — Z6824 Body mass index (BMI) 24.0-24.9, adult: Secondary | ICD-10-CM | POA: Diagnosis not present

## 2017-01-16 DIAGNOSIS — E782 Mixed hyperlipidemia: Secondary | ICD-10-CM | POA: Diagnosis not present

## 2017-01-16 DIAGNOSIS — Z029 Encounter for administrative examinations, unspecified: Secondary | ICD-10-CM

## 2017-01-16 DIAGNOSIS — I10 Essential (primary) hypertension: Secondary | ICD-10-CM | POA: Diagnosis not present

## 2017-01-22 ENCOUNTER — Encounter: Payer: Self-pay | Admitting: Neurology

## 2017-01-25 IMAGING — MR MR CERVICAL SPINE WO/W CM
4 of 8 series · 14 of 48 positions shown · IV contrast (multihance)
Comparison: Brain MRI 09/06/2011.

CLINICAL DATA: Neck pain and stiffness. Mid and lower back pain
with bilateral leg numbness. Thoracolumbar spinal AVM status post
embolizations.

EXAM:
MRI CERVICAL SPINE WITHOUT AND WITH CONTRAST
TECHNIQUE: Multiplanar and multiecho pulse sequences of the cervical spine, to
include the craniocervical junction and cervicothoracic junction,
were obtained without and with intravenous contrast.
CONTRAST:  15 mL MultiHance

[Series 3: T2 · sagittal · 3.0mm · 0.33mm/px · 4 of 15 slices shown (1 of 2)]
[im 1/15]
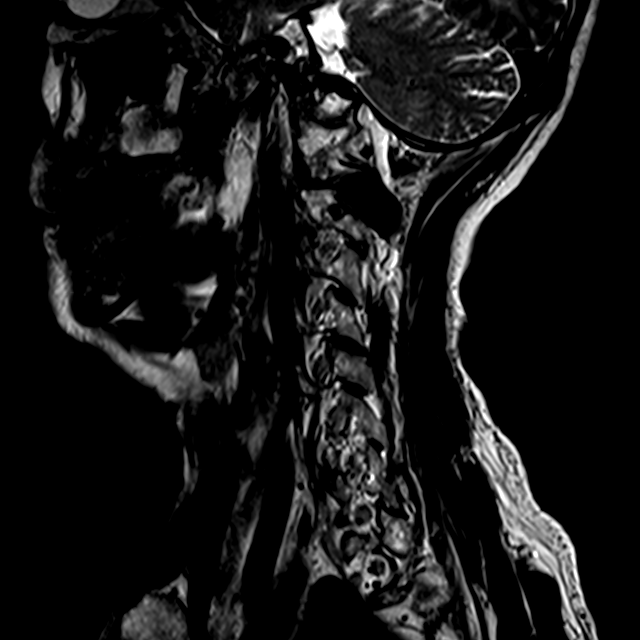
[im 5/15]
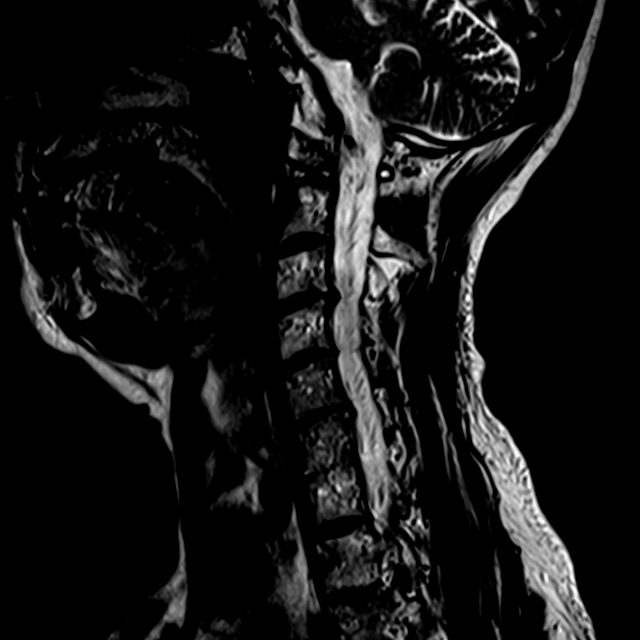
[im 10/15]
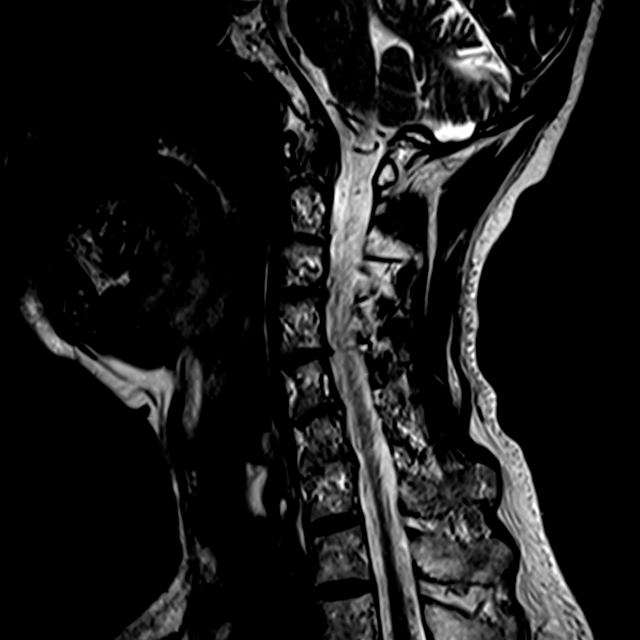
[im 15/15]
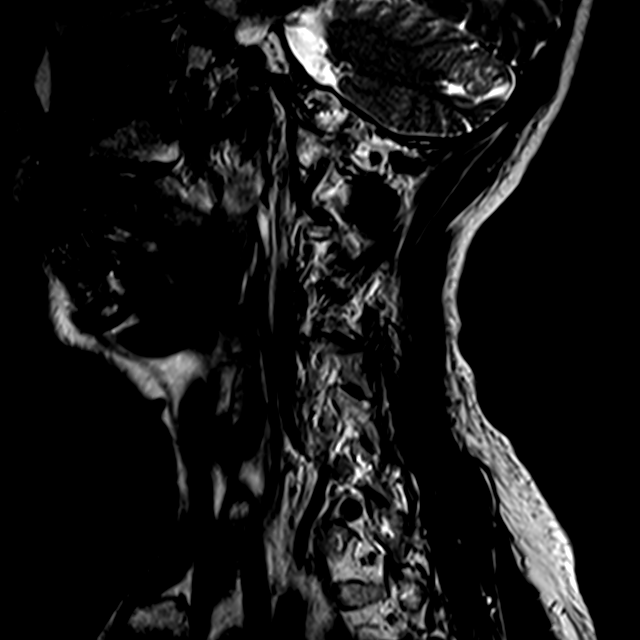

[Series 7: T2 · axial · 3.0mm · 0.22mm/px · z∈[-87,+3]mm · 4 of 33 slices shown (2 of 2)]
[im 1/33]
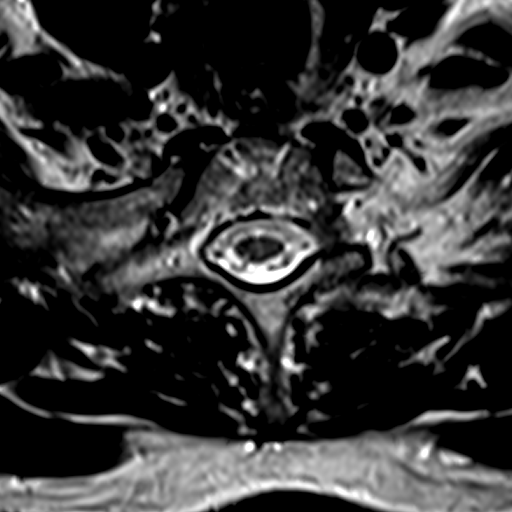
[im 5/33]
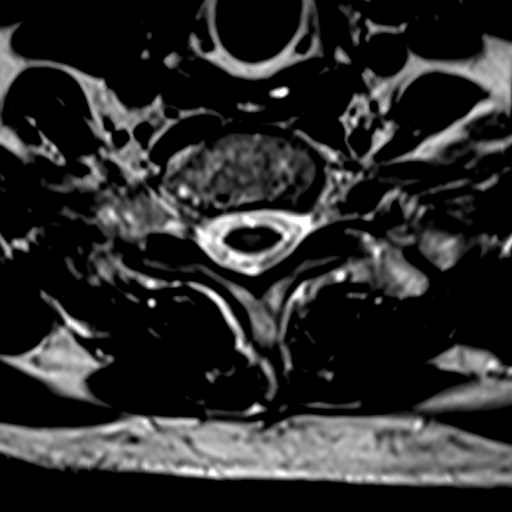
[im 19/33]
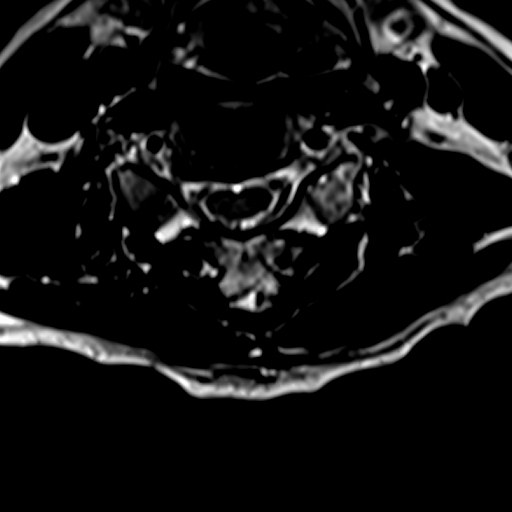
[im 28/33]
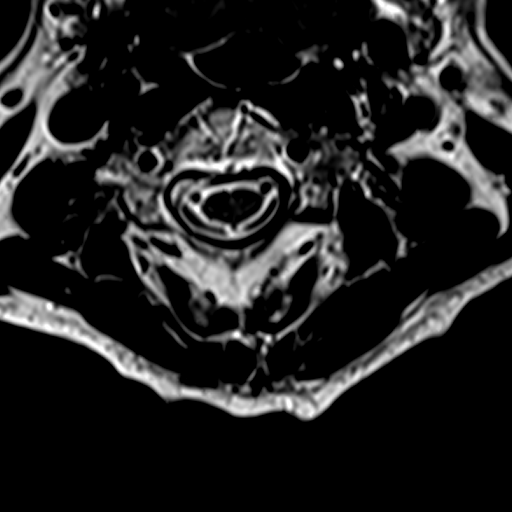

[Series 8: T1 · axial · 3.0mm · 0.17mm/px · z∈[-73,+4]mm · 3 of 34 slices shown (1 of 2)]
[im 5/34]
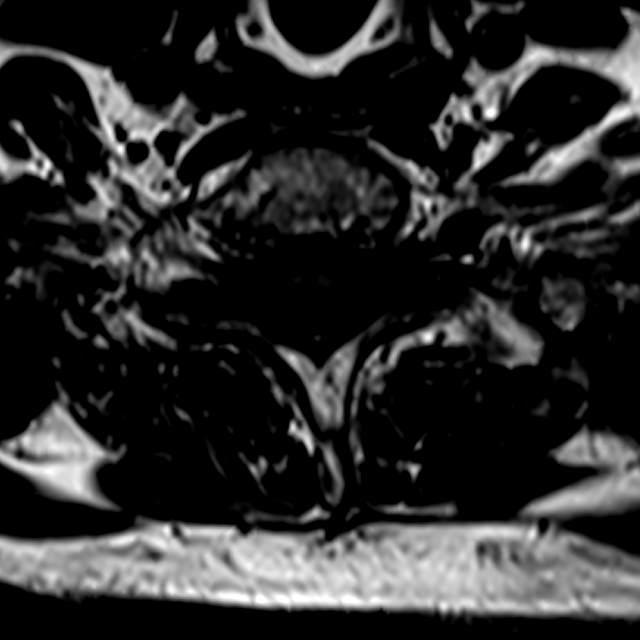
[im 17/34]
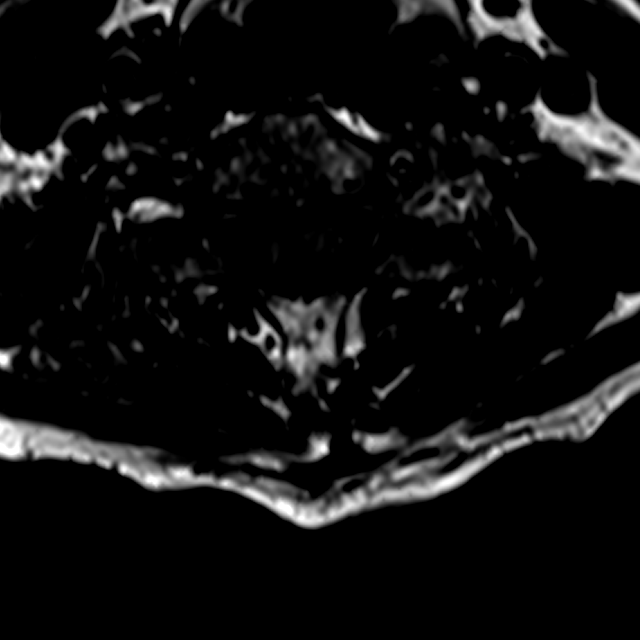
[im 29/34]
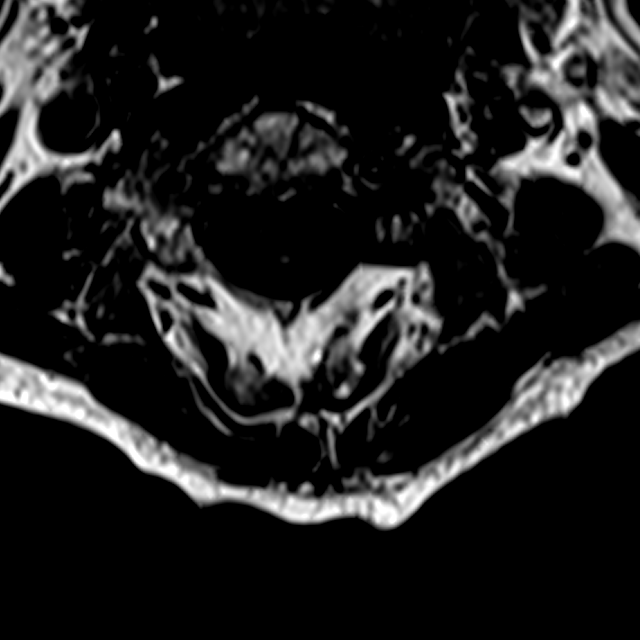

[Series 12: T1 · axial · 3.0mm · 0.19mm/px · z∈[-61,+14]mm · 3 of 34 slices shown (2 of 2)]
[im 5/34]
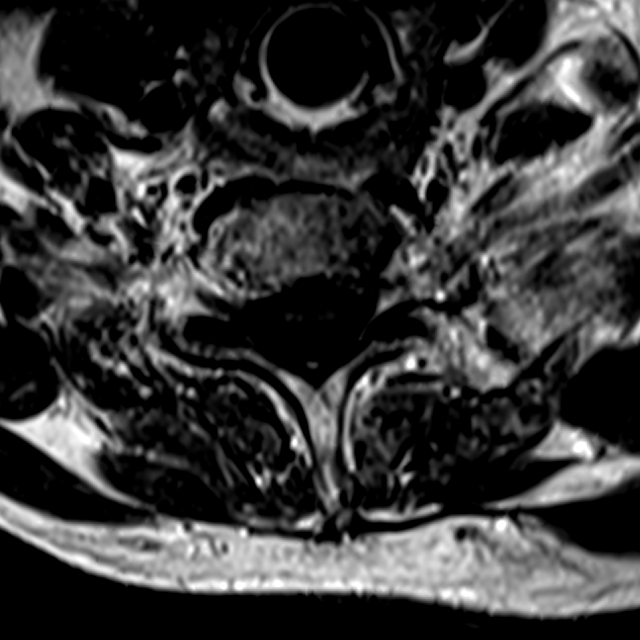
[im 17/34]
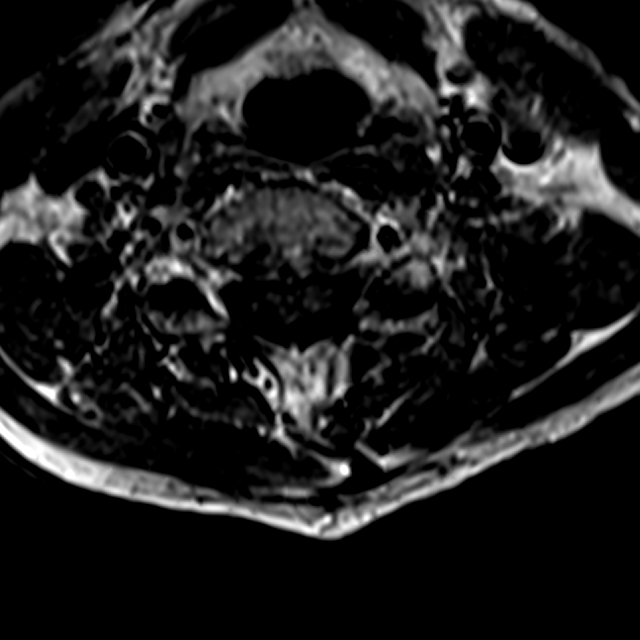
[im 29/34]
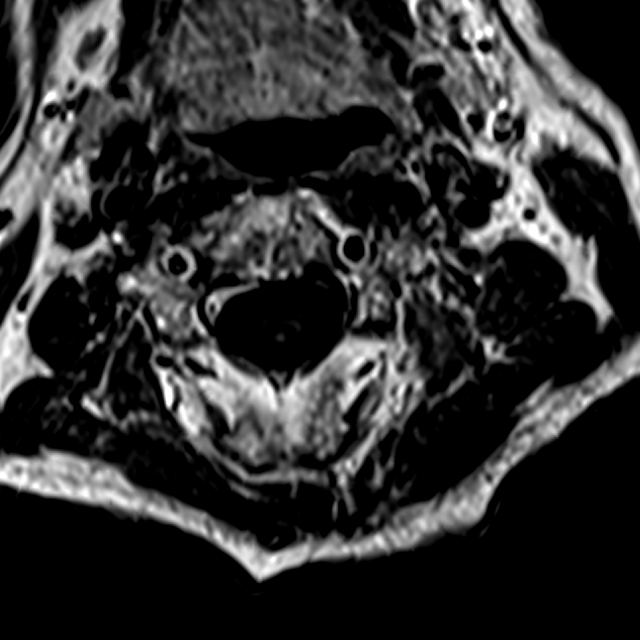

[14 of 48 positions shown; findings below may reference images not displayed]

FINDINGS: Alignment: Mild reversal the normal cervical lordosis. No
significant listhesis.

Vertebrae: Preserve vertebral body heights without evidence of
fracture, suspicious osseous lesion, or significant marrow edema.

Cord: Normal spinal cord signal. Multiple enlarged vessels are
present along the surface of the spinal cord, greatest in the lower
cervical spine as well as the visualized upper thoracic spine.

Posterior Fossa, vertebral arteries, paraspinal tissues: T2
hyperintensity in the pons, present on the prior MRI and likely
reflecting chronic small vessel ischemic disease.

Disc levels:

C2-3:  Negative.

C3-4: Right paracentral disc protrusion and right greater than left
uncovertebral spurring result in borderline to mild spinal stenosis
and moderate right neural foraminal stenosis.

C4-5: Broad-based posterior disc osteophyte complex without
significant stenosis.

C5-6: Broad-based posterior disc osteophyte complex with more focal
right paracentral component results in mild spinal stenosis with a
mild impression on the ventral spinal cord. No neural foraminal
stenosis.

C6-7: Severe disc space narrowing with partial ankylosis across the
disc space. No significant stenosis.

C7-T1:  Negative.
IMPRESSION: 1. Abnormal vessels along the surface of the cervical and visualized
upper thoracic spinal cord related to known AVM. No evidence of
spinal cord edema or hemorrhage.
2. Multilevel cervical disc degeneration resulting in up to mild
spinal stenosis as above.

## 2017-01-25 IMAGING — MR MR LUMBAR SPINE WO/W CM
4 of 7 series · 12 of 48 positions shown · IV contrast (multihance)
Comparison: 09/16/2015

CLINICAL DATA: Neck pain and stiffness. Mid and lower back pain
with bilateral leg numbness. Thoracolumbar spinal AVM status post
embolizations.

EXAM:
MRI LUMBAR SPINE WITHOUT AND WITH CONTRAST
TECHNIQUE: Multiplanar and multiecho pulse sequences of the lumbar spine were
obtained without and with intravenous contrast.
CONTRAST:  15mL MULTIHANCE GADOBENATE DIMEGLUMINE 529 MG/ML IV SOLN

[Series 4: T2 · sagittal · 4.0mm · 0.47mm/px · 3 of 15 slices shown (1 of 3)]
[im 1/15]
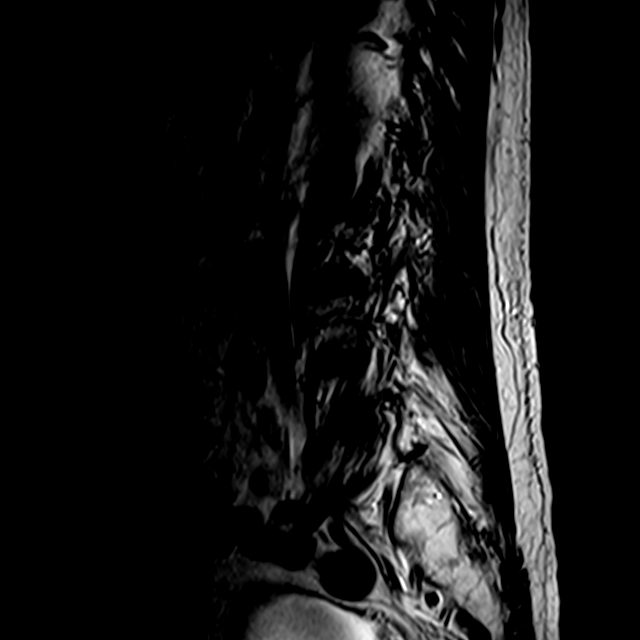
[im 8/15]
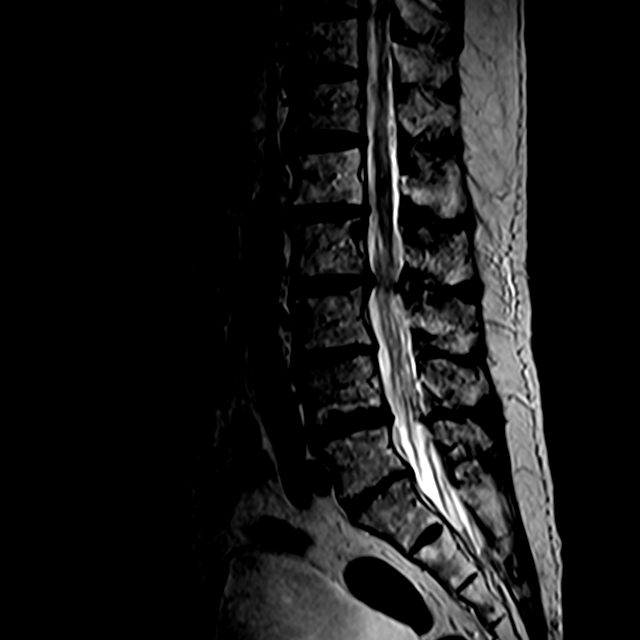
[im 15/15]
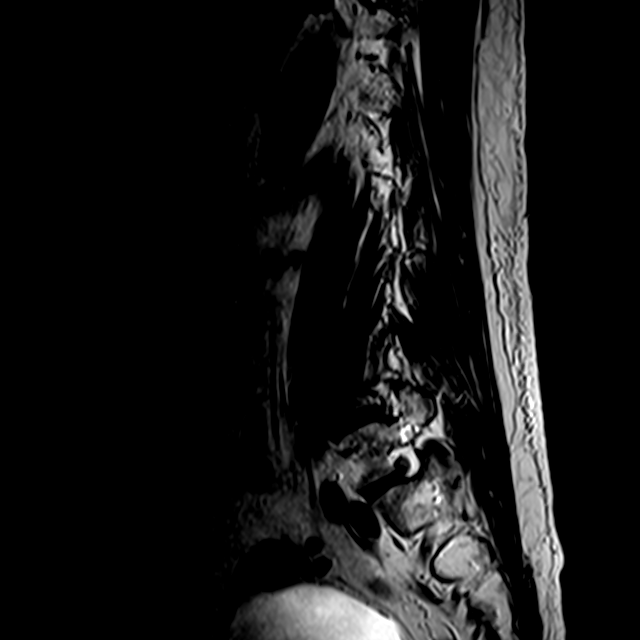

[Series 5: T1 · sagittal · 4.0mm · 0.47mm/px · 3 of 15 slices shown]
[im 1/15]
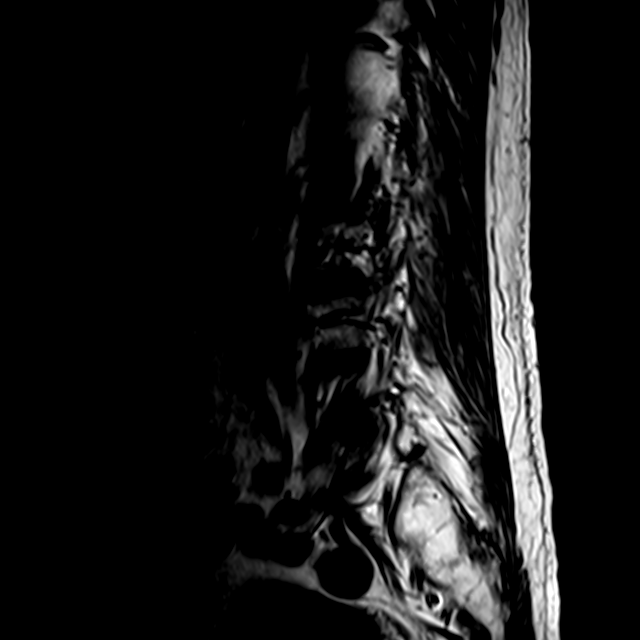
[im 10/15]
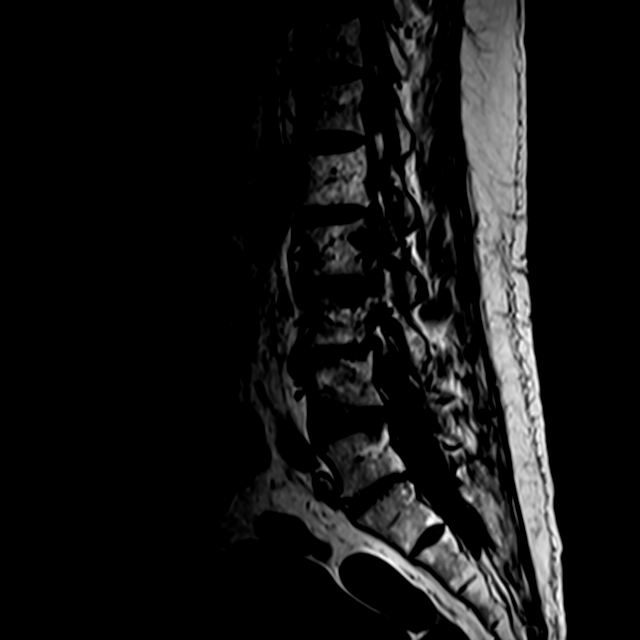
[im 15/15]
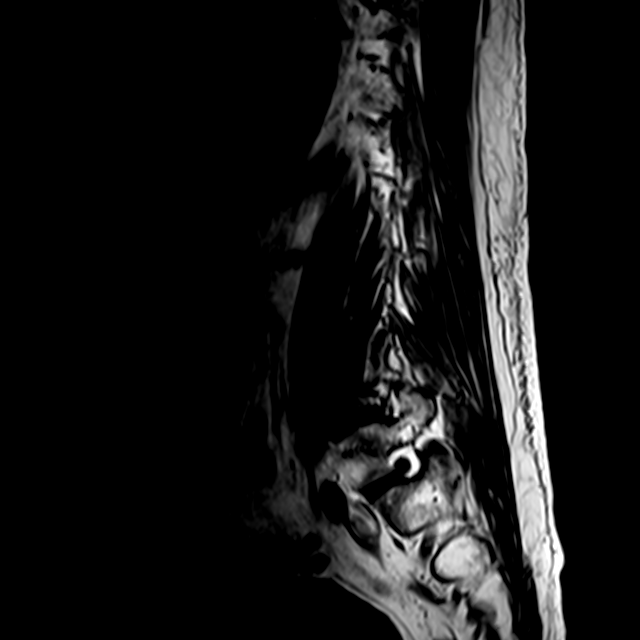

[Series 6: T2 · sagittal · 4.0mm · 0.47mm/px · 3 of 20 slices shown (2 of 3)]
[im 1/20]
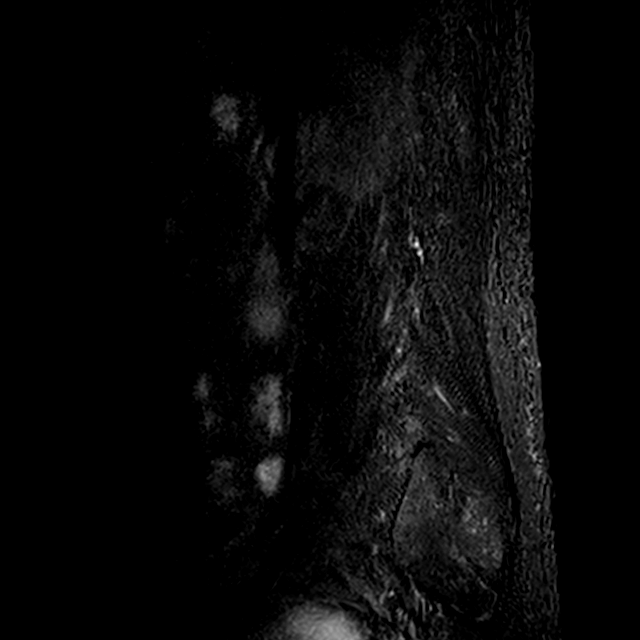
[im 10/20]
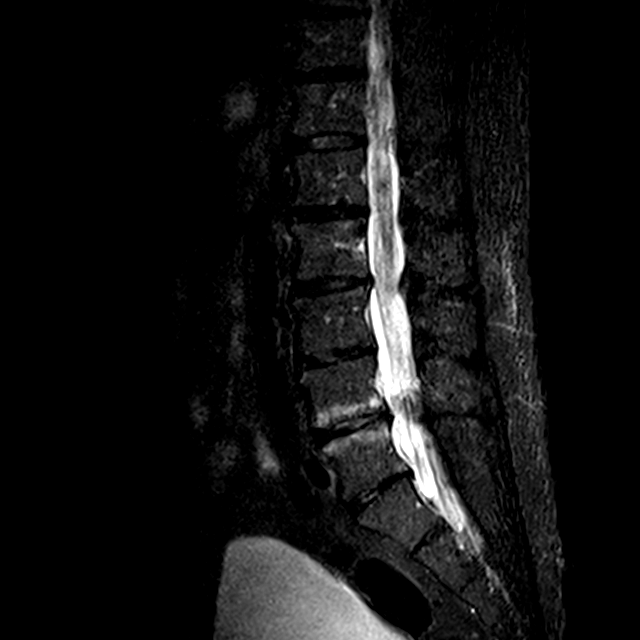
[im 20/20]
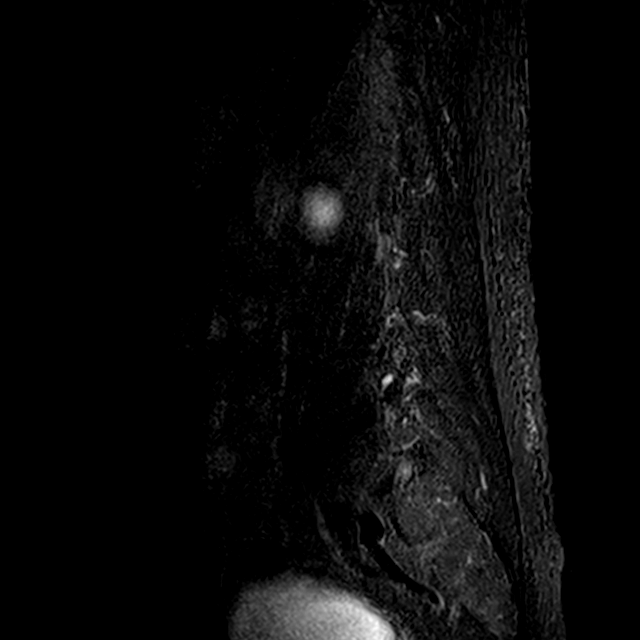

[Series 7: T2 · axial · 4.0mm · 0.25mm/px · z∈[-522,-358]mm · 3 of 46 slices shown (3 of 3)]
[im 9/46]
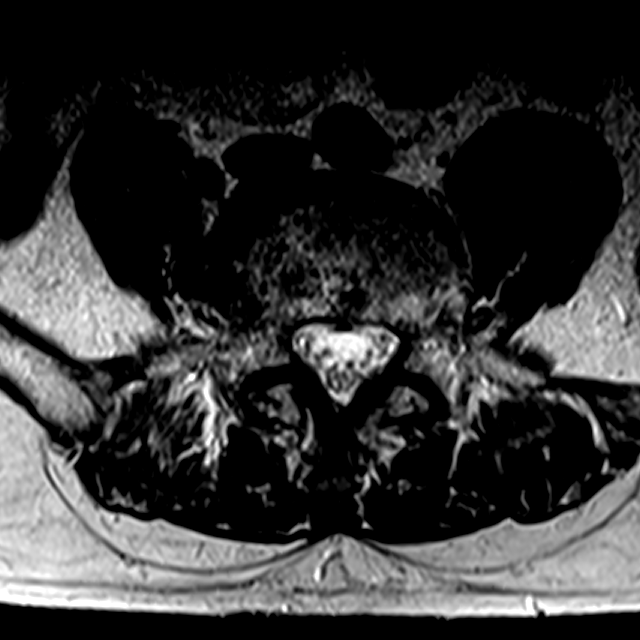
[im 25/46]
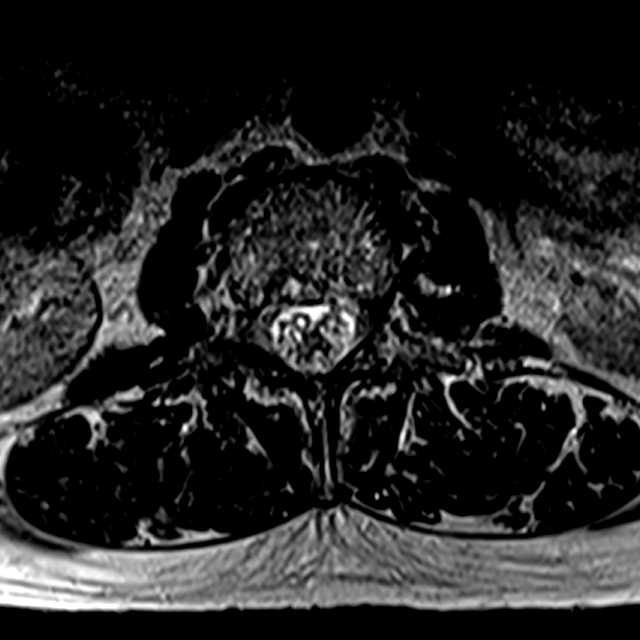
[im 41/46]
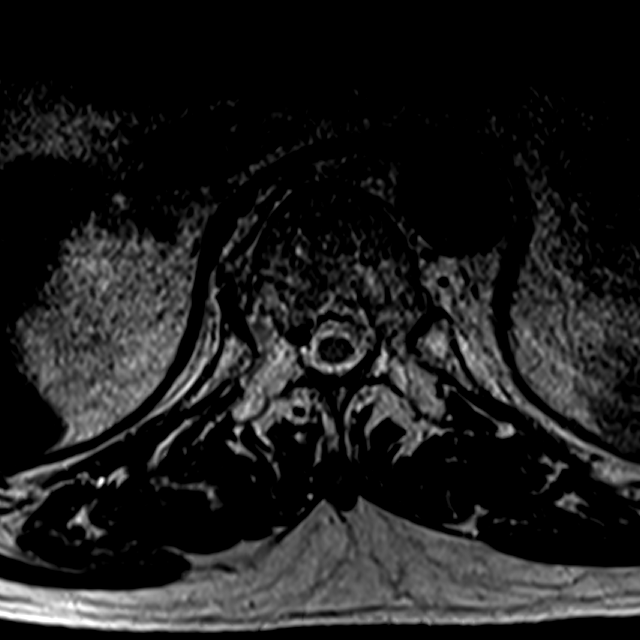

[12 of 48 positions shown; findings below may reference images not displayed]

FINDINGS: Segmentation: Lowest disc space designated L5-S1 as on the prior
study.

Alignment:  Normal.

Vertebrae: Preserved vertebral body heights without evidence of
fracture or suspicious osseous lesion. Degenerative edema at L4-5 is
similar to the prior study.

Conus medullaris: Terminates at L2. Small focus of chronic
hemorrhage in the spinal cord on the right at L1 is unchanged, as is
edema in the distal spinal cord with associated enhancement.
Enlarged epidural venous structures in the lower thoracic and upper
lumbar spinal canal do not appear significantly changed. An enlarged
vessel scallops the posterior L2 vertebral body as previously noted.
Large paravertebral vessels on both sides of the L2 vertebral body
are unchanged, as are prominent vessels in multiple upper lumbar and
lower thoracic neural foramina.

Paraspinal and other soft tissues: No acute abnormality.

Disc levels:

L1-2: Disc desiccation mild disc space narrowing. Mild disc bulging
without spinal stenosis. Unchanged enlarged vessel in the right
neural foramen.

L2-3: Disc desiccation and mild disc space narrowing. Left
subarticular disc extrusion on the prior study has partially
regressed. A residual left subarticular to foraminal disc
protrusion, disc bulging, and mild facet and ligamentum flavum
hypertrophy result in moderate left lateral recess stenosis, mild
left neural foraminal stenosis, and mild spinal stenosis. Left
neural foraminal patency is improved, although there still may be
left L3 nerve root impingement.

L3-4: Disc desiccation and moderate disc space narrowing. Disc
bulging, endplate spurring, and mild facet hypertrophy result in
mild left greater than right neural foraminal stenosis without
spinal stenosis, unchanged.

L4-5: Disc desiccation and mild-to-moderate disc space narrowing.
Disc bulging, endplate spurring, and moderate facet hypertrophy
result in mild-to-moderate bilateral neural foraminal stenosis
without spinal stenosis, unchanged.

L5-S1: Moderate disc space narrowing. Minimal disc bulging and
endplate spurring without stenosis, unchanged.
IMPRESSION: 1. Unchanged small focus of chronic hemorrhage in the spinal cord at
L1 and mild distal spinal cord edema. Unchanged appearance of
prominent epidural and paravertebral vessels in the lower thoracic
and upper lumbar spine related to the known AVM.
2. Partial regression of L2-3 disc herniation with decreased left
lateral recess stenosis.
3. Unchanged disc and facet degeneration elsewhere.

## 2017-01-30 ENCOUNTER — Encounter: Payer: Medicare HMO | Admitting: Neurology

## 2017-01-30 ENCOUNTER — Telehealth (HOSPITAL_COMMUNITY): Payer: Self-pay

## 2017-01-30 NOTE — Telephone Encounter (Signed)
Called to schedule f/u mri, left message for pt to return call. AW 

## 2017-02-13 ENCOUNTER — Ambulatory Visit (INDEPENDENT_AMBULATORY_CARE_PROVIDER_SITE_OTHER): Payer: Medicare HMO | Admitting: Neurology

## 2017-02-13 ENCOUNTER — Telehealth (HOSPITAL_COMMUNITY): Payer: Self-pay

## 2017-02-13 DIAGNOSIS — G629 Polyneuropathy, unspecified: Secondary | ICD-10-CM | POA: Diagnosis not present

## 2017-02-13 NOTE — Procedures (Signed)
[facility]  [address] [place], [address]  [place], [place] [address] Tel: [phone] Fax:  [phone] Test Date:  02/13/2017  Patient: Jeremiah Murphy DOB: 1940-11-22 Physician: Nita Sickle, DO  Sex: Male Height: 5\' 8"  Ref Phys: Nita Sickle, DO  ID#: [id number] Temp: 32.0C Technician:    Patient Complaints: This is a 76 year-old man referred for evaluation of bilateral feet paresthesias.  NCV & EMG Findings: Extensive electrodiagnostic testing of the right lower extremity and additional studies of the left shows:  1. Bilateral sural and superficial peroneal sensory responses are absent. 2. Right peroneal motor response is very mildly reduced. Bilateral tibial and left peroneal motor responses are within normal limits. 3. Right tibial H reflex study is within normal limits. 4. Sparse chronic motor axon loss changes are isolated to the flexor digitorum longus muscles bilaterally, without accompanied active denervation.  Impression: The electrophysiologic findings are most consistent with a distal and symmetric sensorimotor polyneuropathy, axon loss in type, affecting the lower extremities. Overall, these findings are moderate in degree electrically.   ___________________________ Nita Sickle, DO    Nerve Conduction Studies Anti Sensory Summary Table   Site NR Peak (ms) Norm Peak (ms) P-T Amp (V) Norm P-T Amp  Left Sup Peroneal Anti Sensory (Ant Lat Mall)  32C  12 cm NR  <4.6  >3  Right Sup Peroneal Anti Sensory (Ant Lat Mall)  32C  12 cm NR  <4.6  >3  Left Sural Anti Sensory (Lat Mall)  32C  Calf NR  <4.6  >3  Right Sural Anti Sensory (Lat Mall)  32C  Calf NR  <4.6  >3   Motor Summary Table   Site NR Onset (ms) Norm Onset (ms) O-P Amp (mV) Norm O-P Amp Site1 Site2 Delta-0 (ms) Dist (cm) Vel (m/s) Norm Vel (m/s)  Left Peroneal Motor (Ext Dig Brev)  32C  Ankle    3.5 <6.0 3.0 >2.5 B Fib Ankle 8.0 34.0 43 >40  B Fib    11.5  2.9  Poplt B Fib 0.9 8.0  89 >40  Poplt    12.4  3.2         Right Peroneal Motor (Ext Dig Brev)  32C  Ankle    5.1 <6.0 2.4 >2.5 B Fib Ankle 6.5 36.0 55 >40  B Fib    11.6  2.2  Poplt B Fib 1.3 9.0 69 >40  Poplt    12.9  2.2         Left Tibial Motor (Abd Hall Brev)  32C  Ankle    4.8 <6.0 10.3 >4 Knee Ankle 8.4 42.0 50 >40  Knee    13.2  8.8         Right Tibial Motor (Abd Hall Brev)  32C  Ankle    4.6 <6.0 8.7 >4 Knee Ankle 8.4 42.0 50 >40  Knee    13.0  7.6          H Reflex Studies   NR H-Lat (ms) Lat Norm (ms) L-R H-Lat (ms)  Right Tibial (Gastroc)  32C     32.24 <35    EMG   Side Muscle Ins Act Fibs Psw Fasc Number Recrt Dur Dur. Amp Amp. Poly Poly. Comment  Right AntTibialis Nml Nml Nml Nml Nml Nml Nml Nml Nml Nml Nml Nml N/A  Right Gastroc Nml Nml Nml Nml Nml Nml Nml Nml Nml Nml Nml Nml N/A  Right RectFemoris Nml Nml Nml Nml Nml Nml Nml Nml Nml  Nml Nml Nml N/A  Right GluteusMed Nml Nml Nml Nml Nml Nml Nml Nml Nml Nml Nml Nml N/A  Right Flex Dig Long Nml Nml Nml Nml 1- Rapid Few 1+ Few 1+ Nml Nml N/A  Left AntTibialis Nml Nml Nml Nml Nml Nml Nml Nml Nml Nml Nml Nml N/A  Left Gastroc Nml Nml Nml Nml Nml Nml Nml Nml Nml Nml Nml Nml N/A  Left Flex Dig Long Nml Nml Nml Nml 1- Rapid Few 1+ Few 1+ Nml Nml N/A      Waveforms:

## 2017-02-13 NOTE — Telephone Encounter (Signed)
Called to schedule f/u mri, left message for pt to return call. AW 

## 2017-02-14 ENCOUNTER — Telehealth: Payer: Self-pay | Admitting: *Deleted

## 2017-02-14 NOTE — Telephone Encounter (Signed)
-----   Message from [place] Chardonika K Patel, DO sent at 02/14/2017  9:45 AM EDT ----- Please inform pt that his nerve testing confirmed that his tingling is coming from nerve injury in the feet, neuropathy, and not from his back.  If he is tolerating gabapentin okay, we can increase to 300mg  twice daily.

## 2017-02-14 NOTE — Telephone Encounter (Signed)
Left message giving patient results and instructions.   

## 2017-02-22 DIAGNOSIS — R070 Pain in throat: Secondary | ICD-10-CM | POA: Diagnosis not present

## 2017-02-22 DIAGNOSIS — J020 Streptococcal pharyngitis: Secondary | ICD-10-CM | POA: Diagnosis not present

## 2017-02-22 DIAGNOSIS — J069 Acute upper respiratory infection, unspecified: Secondary | ICD-10-CM | POA: Diagnosis not present

## 2017-04-10 ENCOUNTER — Telehealth (HOSPITAL_COMMUNITY): Payer: Self-pay

## 2017-04-10 NOTE — Telephone Encounter (Signed)
Called to schedule mri, left message for pt to return call. AW 

## 2017-04-10 NOTE — Telephone Encounter (Signed)
Called to schedule f/u mri. Pt stated that he doesn't want to go through another mri. He does not want to schedule at all. He says that he is still seeing the neurologist for his parasthesias and he goes to see her in March for f/u. I told him that I would let Dr. Corliss Skainseveshwar know. AW

## 2017-05-22 ENCOUNTER — Telehealth (HOSPITAL_COMMUNITY): Payer: Self-pay

## 2017-05-22 NOTE — Telephone Encounter (Signed)
Returned wife's phone call so that I could get their new phone number. AW

## 2017-05-28 DIAGNOSIS — H5210 Myopia, unspecified eye: Secondary | ICD-10-CM | POA: Diagnosis not present

## 2017-05-28 DIAGNOSIS — H2510 Age-related nuclear cataract, unspecified eye: Secondary | ICD-10-CM | POA: Diagnosis not present

## 2017-05-28 DIAGNOSIS — H18413 Arcus senilis, bilateral: Secondary | ICD-10-CM | POA: Diagnosis not present

## 2017-05-28 DIAGNOSIS — I10 Essential (primary) hypertension: Secondary | ICD-10-CM | POA: Diagnosis not present

## 2017-06-29 DIAGNOSIS — J020 Streptococcal pharyngitis: Secondary | ICD-10-CM | POA: Diagnosis not present

## 2017-06-29 DIAGNOSIS — R070 Pain in throat: Secondary | ICD-10-CM | POA: Diagnosis not present

## 2017-06-29 DIAGNOSIS — E782 Mixed hyperlipidemia: Secondary | ICD-10-CM | POA: Diagnosis not present

## 2017-06-29 DIAGNOSIS — D509 Iron deficiency anemia, unspecified: Secondary | ICD-10-CM | POA: Diagnosis not present

## 2017-06-29 DIAGNOSIS — I10 Essential (primary) hypertension: Secondary | ICD-10-CM | POA: Diagnosis not present

## 2017-06-29 DIAGNOSIS — J069 Acute upper respiratory infection, unspecified: Secondary | ICD-10-CM | POA: Diagnosis not present

## 2017-07-02 ENCOUNTER — Ambulatory Visit: Payer: Medicare HMO | Admitting: Neurology

## 2017-07-03 DIAGNOSIS — D649 Anemia, unspecified: Secondary | ICD-10-CM | POA: Diagnosis not present

## 2017-07-03 DIAGNOSIS — G9009 Other idiopathic peripheral autonomic neuropathy: Secondary | ICD-10-CM | POA: Diagnosis not present

## 2017-07-03 DIAGNOSIS — E782 Mixed hyperlipidemia: Secondary | ICD-10-CM | POA: Diagnosis not present

## 2017-07-03 DIAGNOSIS — I10 Essential (primary) hypertension: Secondary | ICD-10-CM | POA: Diagnosis not present

## 2017-08-31 ENCOUNTER — Ambulatory Visit (HOSPITAL_COMMUNITY)
Admission: RE | Admit: 2017-08-31 | Discharge: 2017-08-31 | Disposition: A | Discharge status: Home / Self Care | Payer: Medicare HMO | Source: Ambulatory Visit | Attending: Internal Medicine | Admitting: Internal Medicine

## 2017-08-31 ENCOUNTER — Other Ambulatory Visit (HOSPITAL_COMMUNITY): Payer: Self-pay | Admitting: Internal Medicine

## 2017-08-31 DIAGNOSIS — M25531 Pain in right wrist: Secondary | ICD-10-CM | POA: Diagnosis not present

## 2017-08-31 DIAGNOSIS — D649 Anemia, unspecified: Secondary | ICD-10-CM | POA: Diagnosis not present

## 2017-08-31 DIAGNOSIS — M79641 Pain in right hand: Secondary | ICD-10-CM | POA: Diagnosis not present

## 2017-08-31 DIAGNOSIS — S6991XA Unspecified injury of right wrist, hand and finger(s), initial encounter: Secondary | ICD-10-CM | POA: Insufficient documentation

## 2017-08-31 DIAGNOSIS — G9009 Other idiopathic peripheral autonomic neuropathy: Secondary | ICD-10-CM | POA: Diagnosis not present

## 2017-08-31 DIAGNOSIS — J069 Acute upper respiratory infection, unspecified: Secondary | ICD-10-CM | POA: Diagnosis not present

## 2017-08-31 DIAGNOSIS — I10 Essential (primary) hypertension: Secondary | ICD-10-CM | POA: Diagnosis not present

## 2017-08-31 DIAGNOSIS — Z6826 Body mass index (BMI) 26.0-26.9, adult: Secondary | ICD-10-CM | POA: Diagnosis not present

## 2017-08-31 DIAGNOSIS — R070 Pain in throat: Secondary | ICD-10-CM | POA: Diagnosis not present

## 2017-08-31 DIAGNOSIS — E782 Mixed hyperlipidemia: Secondary | ICD-10-CM | POA: Diagnosis not present

## 2017-08-31 DIAGNOSIS — W19XXXA Unspecified fall, initial encounter: Secondary | ICD-10-CM | POA: Diagnosis not present

## 2017-08-31 DIAGNOSIS — J020 Streptococcal pharyngitis: Secondary | ICD-10-CM | POA: Diagnosis not present

## 2017-08-31 DIAGNOSIS — S6981XA Other specified injuries of right wrist, hand and finger(s), initial encounter: Secondary | ICD-10-CM | POA: Diagnosis not present

## 2017-11-16 ENCOUNTER — Encounter: Payer: Self-pay | Admitting: Neurology

## 2017-11-16 ENCOUNTER — Ambulatory Visit: Payer: Medicare HMO | Admitting: Neurology

## 2017-11-16 VITALS — BP 130/74 | HR 83 | Ht 68.0 in | Wt 162.5 lb

## 2017-11-16 DIAGNOSIS — G629 Polyneuropathy, unspecified: Secondary | ICD-10-CM | POA: Diagnosis not present

## 2017-11-16 MED ORDER — GABAPENTIN 100 MG PO CAPS: ORAL_CAPSULE | ORAL | 3 refills | Status: DC

## 2017-11-16 MED ORDER — GABAPENTIN 300 MG PO CAPS: 300.0000 mg | ORAL_CAPSULE | Freq: Every day | ORAL | 3 refills | Status: DC

## 2017-11-16 NOTE — Patient Instructions (Signed)
Take gabapentin 100mg  + 300mg  tab at bedtime to see if we can control you pain better  Call with an update in 2 months  Return to clinic in 1 year

## 2017-11-16 NOTE — Progress Notes (Signed)
Follow-up Visit   Date: 11/16/17    Jeremiah CellaDouglas A Murphy MRN: [id number] DOB: 05/30/1940   Interim History: Jeremiah CellaDouglas A Jeremiah Murphy is a 77 y.o. right-handed [place] male with thoracolumbar spinal dural AVF s/p embolization, hyperlipidemia, and hypertension returning to the clinic for follow-up of neuropathy.  The patient was accompanied to the clinic by wife who also provides collateral information.    History of present illness: He was diagnosed with lumbar spinal dural AVF in 2016 and is followed by Dr. Corliss Skainseveshwar who performed embolization in 2017.  Prior to this, he reports numbness and tingling of both feet for several years.  Symptoms are constant and make him feel as if his feet is in sand.  Symptoms became more noticeable following his embolization in 2017.  He had not identified any exacerbating or alleviating factors. She endorses imbalance and walks unassisted.  He has not suffered any falls.  He does not have any weakness of the legs.  He denies diabetes, alcohol use, or family history of neuropathy.   UPDATE 11/16/2017:  He is here for 6 month follow-up visit.  Bilateral feet pain has significantly improved after starting gabapentin 300mg  at bedtime. He endorses increased sleepiness when taking the medication and therefore only takes it at nighttime.  He still has some tingling which bothers him in the afternoon and evening.  Wife feels that his balance and walks has also improved. He has not had any falls and walks unassisted.  No new complaints.   Medications:  Current Outpatient Medications on File Prior to Visit  Medication Sig Dispense Refill  . acetaminophen (TYLENOL) 500 MG tablet Take 500 mg by mouth at bedtime as needed for mild pain or headache.     Marland Kitchen. aspirin (ECOTRIN LOW STRENGTH) 81 MG EC tablet Take 81 mg by mouth every evening. Swallow whole.    Marland Kitchen. atenolol (TENORMIN) 25 MG tablet Take 25 mg by mouth daily.     . cetirizine (ZYRTEC) 10 MG tablet Take 10 mg by mouth at  bedtime as needed for allergies.     . simvastatin (ZOCOR) 20 MG tablet Take 20 mg by mouth daily at 6 PM.     . Tetrahydroz-Glyc-Hyprom-PEG (VISINE MAXIMUM REDNESS RELIEF) 0.05-0.2-0.36-1 % SOLN Place 1 drop into both eyes daily as needed (for allergy relief).     No current facility-administered medications on file prior to visit.     Allergies:  Allergies  Allergen Reactions  . Fusion Plus [Iron-Fa-B Cmp-C-Biot-Probiotic] Other (See Comments)    constipation    Review of Systems:  CONSTITUTIONAL: No fevers, chills, night sweats, or weight loss.  EYES: No visual changes or eye pain ENT: No hearing changes.  No history of nose bleeds.   RESPIRATORY: No cough, wheezing and shortness of breath.   CARDIOVASCULAR: Negative for chest pain, and palpitations.   GI: Negative for abdominal discomfort, blood in stools or black stools.  No recent change in bowel habits.   GU:  No history of incontinence.   MUSCLOSKELETAL: No history of joint pain or swelling.  No myalgias.   SKIN: Negative for lesions, rash, and itching.   ENDOCRINE: Negative for cold or heat intolerance, polydipsia or goiter.   PSYCH:  No depression or anxiety symptoms.   NEURO: As Above.   Vital Signs:  BP 130/74   Pulse 83   Ht 5\' 8"  (1.727 m)   Wt 162 lb 8 oz (73.7 kg)   SpO2 98%   BMI 24.71 kg/m  General Medical Exam:   General:  Well appearing, comfortable  Eyes/ENT: see cranial nerve examination.   Neck: No masses appreciated.  Full range of motion without tenderness.  No carotid bruits. Respiratory:  Clear to auscultation, good air entry bilaterally.   Cardiac:  Regular rate and rhythm, no murmur.   Ext:  No edema  Neurological Exam: MENTAL STATUS including orientation to time, place, person, recent and remote memory, attention span and concentration, language, and fund of knowledge is normal.  Speech is not dysarthric.  CRANIAL NERVES:  Pupils equal round and reactive to light.  Normal conjugate,  extra-ocular eye movements in all directions of gaze.  No ptosis.  Face is symmetric.   MOTOR:  Motor strength is 5/5 in all extremities.  No atrophy, fasciculations or abnormal movements.  No pronator drift.  Tone is normal.    MSRs:  Reflexes are 2+/4 throughout.  SENSORY:  Mildly diminished vibration at the ankles.  COORDINATION/GAIT:  Normal finger-to- nose-finger and heel-to-shin.  Intact rapid alternating movements bilaterally.  Gait narrow based and stable.   Data: NCS/EMG of the legs 02/13/2017:  The electrophysiologic findings are most consistent with a distal and symmetric sensorimotor polyneuropathy, axon loss in type, affecting the lower extremities. Overall, these findings are moderate in degree electrically.  Labs 12/27/2017:  TSH 1.50, vitamin B12 430, copper 105, HbA1c 5.8, SPEP with IFE no M protein   IMPRESSION/PLAN: Idiopathic peripheral neuropathy affecting the feet. Pain has improved on gabapentin, will slowly optimize this as he has noticed increased sedation.   - Increase gabapentin to 400mg  at bedtime  - Call with update in 2 months to determine further titration  Thoracolumbar spinal dural AVF, followed by Dr. Corliss Skains  Return to clinic in 1 year   Thank you for allowing me to participate in patient's care.  If I can answer any additional questions, I would be pleased to do so.    Sincerely,    Demi Trieu K. Allena Katz, DO

## 2017-11-23 ENCOUNTER — Ambulatory Visit: Payer: Medicare HMO | Admitting: Neurology

## 2017-12-28 ENCOUNTER — Encounter: Payer: Self-pay | Admitting: Neurology

## 2018-01-03 DIAGNOSIS — G9009 Other idiopathic peripheral autonomic neuropathy: Secondary | ICD-10-CM | POA: Diagnosis not present

## 2018-01-03 DIAGNOSIS — S6981XA Other specified injuries of right wrist, hand and finger(s), initial encounter: Secondary | ICD-10-CM | POA: Diagnosis not present

## 2018-01-03 DIAGNOSIS — Z6826 Body mass index (BMI) 26.0-26.9, adult: Secondary | ICD-10-CM | POA: Diagnosis not present

## 2018-01-03 DIAGNOSIS — R070 Pain in throat: Secondary | ICD-10-CM | POA: Diagnosis not present

## 2018-01-03 DIAGNOSIS — E782 Mixed hyperlipidemia: Secondary | ICD-10-CM | POA: Diagnosis not present

## 2018-01-03 DIAGNOSIS — J020 Streptococcal pharyngitis: Secondary | ICD-10-CM | POA: Diagnosis not present

## 2018-01-03 DIAGNOSIS — I10 Essential (primary) hypertension: Secondary | ICD-10-CM | POA: Diagnosis not present

## 2018-01-03 DIAGNOSIS — D649 Anemia, unspecified: Secondary | ICD-10-CM | POA: Diagnosis not present

## 2018-01-03 DIAGNOSIS — J069 Acute upper respiratory infection, unspecified: Secondary | ICD-10-CM | POA: Diagnosis not present

## 2018-01-08 DIAGNOSIS — N183 Chronic kidney disease, stage 3 (moderate): Secondary | ICD-10-CM | POA: Diagnosis not present

## 2018-01-08 DIAGNOSIS — E782 Mixed hyperlipidemia: Secondary | ICD-10-CM | POA: Diagnosis not present

## 2018-01-08 DIAGNOSIS — Z23 Encounter for immunization: Secondary | ICD-10-CM | POA: Diagnosis not present

## 2018-01-08 DIAGNOSIS — I1310 Hypertensive heart and chronic kidney disease without heart failure, with stage 1 through stage 4 chronic kidney disease, or unspecified chronic kidney disease: Secondary | ICD-10-CM | POA: Diagnosis not present

## 2018-01-08 DIAGNOSIS — G9009 Other idiopathic peripheral autonomic neuropathy: Secondary | ICD-10-CM | POA: Diagnosis not present

## 2018-01-08 DIAGNOSIS — H6120 Impacted cerumen, unspecified ear: Secondary | ICD-10-CM | POA: Diagnosis not present

## 2018-01-08 DIAGNOSIS — D509 Iron deficiency anemia, unspecified: Secondary | ICD-10-CM | POA: Diagnosis not present

## 2018-01-08 DIAGNOSIS — Z0001 Encounter for general adult medical examination with abnormal findings: Secondary | ICD-10-CM | POA: Diagnosis not present

## 2018-01-29 ENCOUNTER — Telehealth: Payer: Self-pay | Admitting: *Deleted

## 2018-01-29 NOTE — Telephone Encounter (Signed)
Patient called on 01-22-18 to let you know that he is doing much better.

## 2018-07-12 DIAGNOSIS — N183 Chronic kidney disease, stage 3 (moderate): Secondary | ICD-10-CM | POA: Diagnosis not present

## 2018-07-12 DIAGNOSIS — E782 Mixed hyperlipidemia: Secondary | ICD-10-CM | POA: Diagnosis not present

## 2018-07-12 DIAGNOSIS — D509 Iron deficiency anemia, unspecified: Secondary | ICD-10-CM | POA: Diagnosis not present

## 2018-07-12 DIAGNOSIS — I10 Essential (primary) hypertension: Secondary | ICD-10-CM | POA: Diagnosis not present

## 2018-07-12 DIAGNOSIS — D649 Anemia, unspecified: Secondary | ICD-10-CM | POA: Diagnosis not present

## 2018-07-16 DIAGNOSIS — N183 Chronic kidney disease, stage 3 (moderate): Secondary | ICD-10-CM | POA: Diagnosis not present

## 2018-07-16 DIAGNOSIS — I10 Essential (primary) hypertension: Secondary | ICD-10-CM | POA: Diagnosis not present

## 2018-07-16 DIAGNOSIS — G629 Polyneuropathy, unspecified: Secondary | ICD-10-CM | POA: Diagnosis not present

## 2018-07-16 DIAGNOSIS — D649 Anemia, unspecified: Secondary | ICD-10-CM | POA: Diagnosis not present

## 2018-07-16 DIAGNOSIS — E782 Mixed hyperlipidemia: Secondary | ICD-10-CM | POA: Diagnosis not present

## 2018-08-29 DIAGNOSIS — Z0000 Encounter for general adult medical examination without abnormal findings: Secondary | ICD-10-CM | POA: Diagnosis not present

## 2018-09-14 DIAGNOSIS — H6123 Impacted cerumen, bilateral: Secondary | ICD-10-CM | POA: Diagnosis not present

## 2018-11-18 ENCOUNTER — Encounter: Payer: Self-pay | Admitting: Neurology

## 2018-11-18 ENCOUNTER — Telehealth: Payer: Self-pay | Admitting: Neurology

## 2018-11-18 ENCOUNTER — Telehealth (INDEPENDENT_AMBULATORY_CARE_PROVIDER_SITE_OTHER): Payer: Medicare HMO | Admitting: Neurology

## 2018-11-18 DIAGNOSIS — G629 Polyneuropathy, unspecified: Secondary | ICD-10-CM | POA: Diagnosis not present

## 2018-11-18 MED ORDER — GABAPENTIN 100 MG PO CAPS: ORAL_CAPSULE | ORAL | 3 refills | Status: AC

## 2018-11-18 MED ORDER — GABAPENTIN 300 MG PO CAPS: 300.0000 mg | ORAL_CAPSULE | Freq: Every day | ORAL | 3 refills | Status: AC

## 2018-11-18 NOTE — Telephone Encounter (Signed)
Unable to get thru on phone at [phone]

## 2018-11-18 NOTE — Progress Notes (Signed)
   Virtual Visit via Video Note The purpose of this virtual visit is to provide medical care while limiting exposure to the novel coronavirus.    Consent was obtained for video visit:  Yes.   Answered questions that patient had about telehealth interaction:  Yes.   I discussed the limitations, risks, security and privacy concerns of performing an evaluation and management service by telemedicine. I also discussed with the patient that there may be a patient responsible charge related to this service. The patient expressed understanding and agreed to proceed.  Pt location: Home Physician Location: office Name of referring provider:  Celene Squibb, MD I connected with Jeremiah Murphy at patients initiation/request on 11/18/2018 at  3:30 PM EDT by video enabled telemedicine application and verified that I am speaking with the correct person using two identifiers. Pt MRN:  [id number] Pt DOB:  [birth year] Video Participants:  Jeremiah Murphy;  wife   History of Present Illness: This is a 78 y.o. male returning for follow-up of neuropathy.  His neuropathy is stable and pain is well controlled on gabapentin 100 mg at 4 PM and 300 mg at bedtime.  He denies any imbalance or weakness.  He walks unassisted and has not suffered any falls.  Wife feels that he is doing very well.  Occasionally, his left foot and ankle gets swollen, and then improved by itself.  They are happy with current dosing regimen of gabapentin and are requesting a refill.   Observations/Objective:   Patient is awake, alert, and appears comfortable.  Oriented x 4.   Extraocular muscles are intact. No ptosis.  Face is symmetric.  Speech is not dysarthric. Tongue is midline. Antigravity in all extremities.  No pronator drift. Gait appears normal, unassisted  Assessment and Plan:  Idiopathic peripheral neuropathy with painful paresthesias, stable.  Pain is well controlled on gabapentin 100 mg at 4 PM and 300 mg at bedtime.  Refills  were provided for 1 year.   He does not have any imbalance, but did educate him on taking extra precautions on uneven ground   Follow Up Instructions:   I discussed the assessment and treatment plan with the patient. The patient was provided an opportunity to ask questions and all were answered. The patient agreed with the plan and demonstrated an understanding of the instructions.   The patient was advised to call back or seek an in-person evaluation if the symptoms worsen or if the condition fails to improve as anticipated.  Follow-up in 1 year  Total time spent:  15 minutes     Alda Berthold, DO

## 2018-11-18 NOTE — Telephone Encounter (Signed)
Pt left vm that he needs to speak to the nurse please call

## 2019-01-10 DIAGNOSIS — I10 Essential (primary) hypertension: Secondary | ICD-10-CM | POA: Diagnosis not present

## 2019-01-10 DIAGNOSIS — D649 Anemia, unspecified: Secondary | ICD-10-CM | POA: Diagnosis not present

## 2019-01-10 DIAGNOSIS — E782 Mixed hyperlipidemia: Secondary | ICD-10-CM | POA: Diagnosis not present

## 2019-01-14 DIAGNOSIS — G629 Polyneuropathy, unspecified: Secondary | ICD-10-CM | POA: Diagnosis not present

## 2019-01-14 DIAGNOSIS — E782 Mixed hyperlipidemia: Secondary | ICD-10-CM | POA: Diagnosis not present

## 2019-01-14 DIAGNOSIS — I129 Hypertensive chronic kidney disease with stage 1 through stage 4 chronic kidney disease, or unspecified chronic kidney disease: Secondary | ICD-10-CM | POA: Diagnosis not present

## 2019-01-14 DIAGNOSIS — N183 Chronic kidney disease, stage 3 (moderate): Secondary | ICD-10-CM | POA: Diagnosis not present

## 2019-01-14 DIAGNOSIS — D649 Anemia, unspecified: Secondary | ICD-10-CM | POA: Diagnosis not present

## 2019-02-03 ENCOUNTER — Other Ambulatory Visit (HOSPITAL_COMMUNITY): Payer: Self-pay | Admitting: Internal Medicine

## 2019-02-03 ENCOUNTER — Other Ambulatory Visit: Payer: Self-pay

## 2019-02-03 ENCOUNTER — Ambulatory Visit (HOSPITAL_COMMUNITY)
Admission: RE | Admit: 2019-02-03 | Discharge: 2019-02-03 | Disposition: A | Discharge status: Home / Self Care | Payer: Medicare HMO | Source: Ambulatory Visit | Attending: Internal Medicine | Admitting: Internal Medicine

## 2019-02-03 DIAGNOSIS — S6991XA Unspecified injury of right wrist, hand and finger(s), initial encounter: Secondary | ICD-10-CM | POA: Diagnosis not present

## 2019-02-03 DIAGNOSIS — M79641 Pain in right hand: Secondary | ICD-10-CM

## 2019-02-10 DIAGNOSIS — M25531 Pain in right wrist: Secondary | ICD-10-CM | POA: Diagnosis not present

## 2019-03-05 ENCOUNTER — Ambulatory Visit: Payer: Medicare HMO | Admitting: Orthopaedic Surgery

## 2019-03-12 ENCOUNTER — Ambulatory Visit: Payer: Medicare HMO | Admitting: Orthopaedic Surgery

## 2019-03-26 ENCOUNTER — Other Ambulatory Visit: Payer: Self-pay

## 2019-03-26 ENCOUNTER — Encounter: Payer: Self-pay | Admitting: Orthopaedic Surgery

## 2019-03-26 ENCOUNTER — Ambulatory Visit (INDEPENDENT_AMBULATORY_CARE_PROVIDER_SITE_OTHER): Payer: Medicare HMO | Admitting: Orthopaedic Surgery

## 2019-03-26 DIAGNOSIS — M25531 Pain in right wrist: Secondary | ICD-10-CM | POA: Diagnosis not present

## 2019-03-26 NOTE — Progress Notes (Signed)
Office Visit Note   Patient: Jeremiah Murphy           Date of Birth: August 04, 1940           MRN: [id number] Visit Date: 03/26/2019              Requested by: Benita Stabile, MD [address] [place],  [place] [address] PCP: Benita Stabile, MD   Assessment & Plan: Visit Diagnoses:  1. Pain in right wrist     Plan: I am encouraged by the fact the patient is feeling better but he is still having pain in the wrist.  I do feel a Velcro wrist splint would give him more support for the wrist.  I have also recommended a topical anti-inflammatory over-the-counter such as Voltaren gel to try twice daily.  We can always see him back in 4 weeks to repeat an exam of the wrist and determine whether or not he would benefit from potential even a steroid injection.  Follow-Up Instructions: Return in about 4 weeks (around 04/23/2019).   Orders:  No orders of the defined types were placed in this encounter.  No orders of the defined types were placed in this encounter.     Procedures: No procedures performed   Clinical Data: No additional findings.   Subjective: Chief Complaint  Patient presents with  . Right Wrist - Pain  The patient is a right-hand-dominant 78 year old who comes in with his wife today for evaluation treatment of right wrist pain.  He actually has x-rays on the [facility] system.  He is referred from Dr. Dwana Melena who did obtain the x-rays.  A month ago after the patient fell his wrist was very painful and stiff but now he is moving it better and his pain is less but his pain is at the ulnar aspect of the wrist where he points.  He says opening and closing doors and other things is still very painful to him.  He just been wearing a Ace wrap around his wrist.  He denies any numbness and tingling.  He denies any other past injuries to the right wrist.  HPI  Review of Systems He currently denies any headache, chest pain, shortness of breath, fever, chills, nausea, vomiting   Objective: Vital Signs: There were no vitals taken for this visit.  Physical Exam He is alert and orient x3 and in no acute distress Ortho Exam Examination of his right wrist shows tenderness over the ulnar styloid and the TFCC area.  He has pain with the extremes of pronation supination.  There is some pain with flexion extension.  The DRUJ is stable on exam.  He is neurovascularly intact and does have good grip and pinch strength. Specialty Comments:  No specialty comments available.  Imaging: No results found. Multiple views of the right wrist independently reviewed showed no evidence of fracture or malalignment of the distal radius or the carpal bones in the wrist.  The alignment is anatomic.  PMFS History: Patient Active Problem List   Diagnosis Date Noted  . 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 A-V fistula (HCC)   . Chronic low back pain with bilateral sciatica   . Vertigo 07/10/2014  . HTN (hypertension) 07/10/2014  . Peripheral vertigo 07/10/2014  . Neurogenic pain, leg 01/19/2014  . Normocytic anemia 06/24/2013  . GANGLION CYST 05/24/2010   Past Medical History:  Diagnosis Date  . Arthritis   . Coronary artery disease   .  Dyslipidemia   . HTN (hypertension)   . Irregular heart rate    "years ago"  . Normocytic anemia   . Pneumonia    "walking" pneumonia  . Vertigo     Family History  Problem Relation Age of Onset  . Breast cancer Mother   . Breast cancer Sister   . Breast cancer Sister   . Other Father        5, died from fall  . Colon cancer Neg Hx     Past Surgical History:  Procedure Laterality Date  . CARDIAC CATHETERIZATION  1999   "balloon" procedure; 11/25/02 [facility] (Dr. Elijio Miles, [place]): 60% pLCX and 90% dLCX, RCA occlusion. Med Tx. EF > 55% by echo.   . [place]   left  . EYE SURGERY Bilateral    cataract surgery   . IR GENERIC HISTORICAL  12/20/2015   IR RADIOLOGIST EVAL & MGMT 12/20/2015 [facility]   . IR RADIOLOGIST EVAL & MGMT  07/28/2016  . RADIOLOGY WITH ANESTHESIA N/A 04/15/2015   Procedure: SPINAL ANGIOGRAM WITH TREATMENT    (RADIOLOGY WITH ANESTHESIA);  Surgeon: Luanne Bras, MD;  Location: [facility];  Service: Radiology;  Laterality: N/A;  . RADIOLOGY WITH ANESTHESIA N/A 05/17/2015   Procedure: RADIOLOGY WITH ANESTHESIA;  Surgeon: Luanne Bras, MD;  Location: [facility];  Service: Radiology;  Laterality: N/A;  . RADIOLOGY WITH ANESTHESIA N/A 07/26/2015   Procedure: EMBOLIZATION         (RADIOLOGY WITH ANESTHESIA);  Surgeon: Luanne Bras, MD;  Location: [facility];  Service: Radiology;  Laterality: N/A;  . TONSILLECTOMY     Social History   Occupational History  . Occupation: retired  Tobacco Use  . Smoking status: Never Smoker  . Smokeless tobacco: Never Used  Substance and Sexual Activity  . Alcohol use: No  . Drug use: No  . Sexual activity: Not on file

## 2019-04-23 ENCOUNTER — Ambulatory Visit: Payer: Medicare HMO | Admitting: Orthopaedic Surgery

## 2019-05-06 ENCOUNTER — Ambulatory Visit: Payer: Medicare HMO | Admitting: Orthopaedic Surgery

## 2019-06-27 ENCOUNTER — Ambulatory Visit: Payer: Medicare HMO | Attending: Internal Medicine

## 2019-06-27 DIAGNOSIS — Z23 Encounter for immunization: Secondary | ICD-10-CM | POA: Insufficient documentation

## 2019-06-27 NOTE — Progress Notes (Signed)
   Covid-19 Vaccination Clinic  Name:  Jeremiah Murphy    MRN: [id number] DOB: January 21, 1941  06/27/2019  Mr. Tool was observed post Covid-19 immunization for 15 minutes without incident. He was provided with Vaccine Information Sheet and instruction to access the V-Safe system.   [name] was instructed to call 911 with any severe reactions post vaccine: Marland Kitchen Difficulty breathing  . Swelling of face and throat  . A fast heartbeat  . A bad rash all over body  . Dizziness and weakness   Immunizations Administered    Name Date Dose VIS Date Route   Moderna COVID-19 Vaccine 06/27/2019 10:36 AM 0.5 mL 03/25/2019 Intramuscular   Manufacturer: Moderna   Lot: [id number]   NDC: 68616-837-29

## 2019-07-14 DIAGNOSIS — E785 Hyperlipidemia, unspecified: Secondary | ICD-10-CM | POA: Diagnosis not present

## 2019-07-14 DIAGNOSIS — D509 Iron deficiency anemia, unspecified: Secondary | ICD-10-CM | POA: Diagnosis not present

## 2019-07-14 DIAGNOSIS — H6120 Impacted cerumen, unspecified ear: Secondary | ICD-10-CM | POA: Diagnosis not present

## 2019-07-14 DIAGNOSIS — Z0001 Encounter for general adult medical examination with abnormal findings: Secondary | ICD-10-CM | POA: Diagnosis not present

## 2019-07-14 DIAGNOSIS — D649 Anemia, unspecified: Secondary | ICD-10-CM | POA: Diagnosis not present

## 2019-07-14 DIAGNOSIS — E782 Mixed hyperlipidemia: Secondary | ICD-10-CM | POA: Diagnosis not present

## 2019-07-14 DIAGNOSIS — G9009 Other idiopathic peripheral autonomic neuropathy: Secondary | ICD-10-CM | POA: Diagnosis not present

## 2019-07-14 DIAGNOSIS — G629 Polyneuropathy, unspecified: Secondary | ICD-10-CM | POA: Diagnosis not present

## 2019-07-14 DIAGNOSIS — Z0000 Encounter for general adult medical examination without abnormal findings: Secondary | ICD-10-CM | POA: Diagnosis not present

## 2019-07-21 DIAGNOSIS — R7301 Impaired fasting glucose: Secondary | ICD-10-CM | POA: Diagnosis not present

## 2019-07-21 DIAGNOSIS — G629 Polyneuropathy, unspecified: Secondary | ICD-10-CM | POA: Diagnosis not present

## 2019-07-21 DIAGNOSIS — N1831 Chronic kidney disease, stage 3a: Secondary | ICD-10-CM | POA: Diagnosis not present

## 2019-07-21 DIAGNOSIS — I129 Hypertensive chronic kidney disease with stage 1 through stage 4 chronic kidney disease, or unspecified chronic kidney disease: Secondary | ICD-10-CM | POA: Diagnosis not present

## 2019-07-21 DIAGNOSIS — D649 Anemia, unspecified: Secondary | ICD-10-CM | POA: Diagnosis not present

## 2019-07-21 DIAGNOSIS — E782 Mixed hyperlipidemia: Secondary | ICD-10-CM | POA: Diagnosis not present

## 2019-07-21 DIAGNOSIS — R7303 Prediabetes: Secondary | ICD-10-CM | POA: Diagnosis not present

## 2019-07-29 ENCOUNTER — Ambulatory Visit: Payer: Medicare HMO | Attending: Internal Medicine

## 2019-07-29 DIAGNOSIS — Z23 Encounter for immunization: Secondary | ICD-10-CM

## 2019-07-29 NOTE — Progress Notes (Signed)
   Covid-19 Vaccination Clinic  Name:  Jeremiah Murphy    MRN: [id number] DOB: Nov 14, 1940  07/29/2019  Mr. Ebrahimi was observed post Covid-19 immunization for 15 minutes without incident. He was provided with Vaccine Information Sheet and instruction to access the V-Safe system.   Mr. Fullen was instructed to call 911 with any severe reactions post vaccine: Marland Kitchen Difficulty breathing  . Swelling of face and throat  . A fast heartbeat  . A bad rash all over body  . Dizziness and weakness   Immunizations Administered    Name Date Dose VIS Date Route   Moderna COVID-19 Vaccine 07/29/2019 10:43 AM 0.5 mL 03/25/2019 Intramuscular   Manufacturer: Moderna   Lot: [id number]   NDC: 20601-561-53

## 2019-08-04 DIAGNOSIS — H6123 Impacted cerumen, bilateral: Secondary | ICD-10-CM | POA: Diagnosis not present

## 2019-08-13 DIAGNOSIS — H6092 Unspecified otitis externa, left ear: Secondary | ICD-10-CM | POA: Diagnosis not present

## 2019-11-19 DIAGNOSIS — H5210 Myopia, unspecified eye: Secondary | ICD-10-CM | POA: Diagnosis not present

## 2019-11-19 DIAGNOSIS — Z0100 Encounter for examination of eyes and vision without abnormal findings: Secondary | ICD-10-CM | POA: Diagnosis not present

## 2019-11-21 ENCOUNTER — Ambulatory Visit: Payer: Medicare HMO | Admitting: Neurology

## 2020-01-05 DIAGNOSIS — H1132 Conjunctival hemorrhage, left eye: Secondary | ICD-10-CM | POA: Diagnosis not present

## 2020-01-16 DIAGNOSIS — E559 Vitamin D deficiency, unspecified: Secondary | ICD-10-CM | POA: Diagnosis not present

## 2020-01-16 DIAGNOSIS — G9009 Other idiopathic peripheral autonomic neuropathy: Secondary | ICD-10-CM | POA: Diagnosis not present

## 2020-01-16 DIAGNOSIS — D649 Anemia, unspecified: Secondary | ICD-10-CM | POA: Diagnosis not present

## 2020-01-16 DIAGNOSIS — Z125 Encounter for screening for malignant neoplasm of prostate: Secondary | ICD-10-CM | POA: Diagnosis not present

## 2020-01-16 DIAGNOSIS — H6092 Unspecified otitis externa, left ear: Secondary | ICD-10-CM | POA: Diagnosis not present

## 2020-01-16 DIAGNOSIS — R7301 Impaired fasting glucose: Secondary | ICD-10-CM | POA: Diagnosis not present

## 2020-01-16 DIAGNOSIS — H6123 Impacted cerumen, bilateral: Secondary | ICD-10-CM | POA: Diagnosis not present

## 2020-01-16 DIAGNOSIS — Z0001 Encounter for general adult medical examination with abnormal findings: Secondary | ICD-10-CM | POA: Diagnosis not present

## 2020-01-16 DIAGNOSIS — I10 Essential (primary) hypertension: Secondary | ICD-10-CM | POA: Diagnosis not present

## 2020-01-16 DIAGNOSIS — Z0000 Encounter for general adult medical examination without abnormal findings: Secondary | ICD-10-CM | POA: Diagnosis not present

## 2020-01-16 DIAGNOSIS — E782 Mixed hyperlipidemia: Secondary | ICD-10-CM | POA: Diagnosis not present

## 2020-01-21 DIAGNOSIS — D649 Anemia, unspecified: Secondary | ICD-10-CM | POA: Diagnosis not present

## 2020-01-21 DIAGNOSIS — Z23 Encounter for immunization: Secondary | ICD-10-CM | POA: Diagnosis not present

## 2020-01-21 DIAGNOSIS — I129 Hypertensive chronic kidney disease with stage 1 through stage 4 chronic kidney disease, or unspecified chronic kidney disease: Secondary | ICD-10-CM | POA: Diagnosis not present

## 2020-01-21 DIAGNOSIS — G629 Polyneuropathy, unspecified: Secondary | ICD-10-CM | POA: Diagnosis not present

## 2020-01-21 DIAGNOSIS — E782 Mixed hyperlipidemia: Secondary | ICD-10-CM | POA: Diagnosis not present

## 2020-01-21 DIAGNOSIS — R7301 Impaired fasting glucose: Secondary | ICD-10-CM | POA: Diagnosis not present

## 2020-01-21 DIAGNOSIS — Z0001 Encounter for general adult medical examination with abnormal findings: Secondary | ICD-10-CM | POA: Diagnosis not present

## 2020-01-21 DIAGNOSIS — N1831 Chronic kidney disease, stage 3a: Secondary | ICD-10-CM | POA: Diagnosis not present

## 2020-01-21 DIAGNOSIS — R7303 Prediabetes: Secondary | ICD-10-CM | POA: Diagnosis not present

## 2020-02-05 DIAGNOSIS — W182XXA Fall in (into) shower or empty bathtub, initial encounter: Secondary | ICD-10-CM | POA: Diagnosis not present

## 2020-02-05 DIAGNOSIS — R0781 Pleurodynia: Secondary | ICD-10-CM | POA: Diagnosis not present

## 2020-02-19 ENCOUNTER — Ambulatory Visit: Payer: Medicare HMO

## 2020-02-23 ENCOUNTER — Ambulatory Visit: Payer: Medicare HMO | Admitting: Neurology

## 2020-02-26 ENCOUNTER — Ambulatory Visit: Payer: Medicare HMO | Attending: Internal Medicine

## 2020-02-26 DIAGNOSIS — Z23 Encounter for immunization: Secondary | ICD-10-CM

## 2020-02-26 NOTE — Progress Notes (Signed)
   Covid-19 Vaccination Clinic  Name:  Jeremiah Murphy    MRN: [id number] DOB: 12-24-1940  02/26/2020  Jeremiah Murphy was observed post Covid-19 immunization for 15 minutes without incident. He was provided with Vaccine Information Sheet and instruction to access the V-Safe system.   Jeremiah Murphy was instructed to call 911 with any severe reactions post vaccine: Marland Kitchen Difficulty breathing  . Swelling of face and throat  . A fast heartbeat  . A bad rash all over body  . Dizziness and weakness

## 2020-03-16 DIAGNOSIS — I129 Hypertensive chronic kidney disease with stage 1 through stage 4 chronic kidney disease, or unspecified chronic kidney disease: Secondary | ICD-10-CM | POA: Diagnosis not present

## 2020-03-16 DIAGNOSIS — D649 Anemia, unspecified: Secondary | ICD-10-CM | POA: Diagnosis not present

## 2020-03-16 DIAGNOSIS — E7849 Other hyperlipidemia: Secondary | ICD-10-CM | POA: Diagnosis not present

## 2020-03-16 DIAGNOSIS — N183 Chronic kidney disease, stage 3 unspecified: Secondary | ICD-10-CM | POA: Diagnosis not present

## 2020-04-23 DIAGNOSIS — N183 Chronic kidney disease, stage 3 unspecified: Secondary | ICD-10-CM | POA: Diagnosis not present

## 2020-04-23 DIAGNOSIS — E7849 Other hyperlipidemia: Secondary | ICD-10-CM | POA: Diagnosis not present

## 2020-04-23 DIAGNOSIS — I129 Hypertensive chronic kidney disease with stage 1 through stage 4 chronic kidney disease, or unspecified chronic kidney disease: Secondary | ICD-10-CM | POA: Diagnosis not present

## 2020-04-23 DIAGNOSIS — D649 Anemia, unspecified: Secondary | ICD-10-CM | POA: Diagnosis not present

## 2020-07-16 DIAGNOSIS — I1310 Hypertensive heart and chronic kidney disease without heart failure, with stage 1 through stage 4 chronic kidney disease, or unspecified chronic kidney disease: Secondary | ICD-10-CM | POA: Diagnosis not present

## 2020-07-16 DIAGNOSIS — D649 Anemia, unspecified: Secondary | ICD-10-CM | POA: Diagnosis not present

## 2020-07-16 DIAGNOSIS — I10 Essential (primary) hypertension: Secondary | ICD-10-CM | POA: Diagnosis not present

## 2020-07-16 DIAGNOSIS — E559 Vitamin D deficiency, unspecified: Secondary | ICD-10-CM | POA: Diagnosis not present

## 2020-07-16 DIAGNOSIS — E7849 Other hyperlipidemia: Secondary | ICD-10-CM | POA: Diagnosis not present

## 2020-07-16 DIAGNOSIS — D509 Iron deficiency anemia, unspecified: Secondary | ICD-10-CM | POA: Diagnosis not present

## 2020-07-16 DIAGNOSIS — E782 Mixed hyperlipidemia: Secondary | ICD-10-CM | POA: Diagnosis not present

## 2020-07-16 DIAGNOSIS — N183 Chronic kidney disease, stage 3 unspecified: Secondary | ICD-10-CM | POA: Diagnosis not present

## 2020-07-16 DIAGNOSIS — N1831 Chronic kidney disease, stage 3a: Secondary | ICD-10-CM | POA: Diagnosis not present

## 2020-07-21 ENCOUNTER — Other Ambulatory Visit (HOSPITAL_COMMUNITY): Payer: Self-pay | Admitting: Internal Medicine

## 2020-07-21 DIAGNOSIS — E663 Overweight: Secondary | ICD-10-CM | POA: Diagnosis not present

## 2020-07-21 DIAGNOSIS — R7301 Impaired fasting glucose: Secondary | ICD-10-CM | POA: Diagnosis not present

## 2020-07-21 DIAGNOSIS — E782 Mixed hyperlipidemia: Secondary | ICD-10-CM | POA: Diagnosis not present

## 2020-07-21 DIAGNOSIS — D649 Anemia, unspecified: Secondary | ICD-10-CM | POA: Diagnosis not present

## 2020-07-21 DIAGNOSIS — M25531 Pain in right wrist: Secondary | ICD-10-CM

## 2020-07-21 DIAGNOSIS — N1831 Chronic kidney disease, stage 3a: Secondary | ICD-10-CM | POA: Diagnosis not present

## 2020-07-21 DIAGNOSIS — E559 Vitamin D deficiency, unspecified: Secondary | ICD-10-CM | POA: Diagnosis not present

## 2020-07-21 DIAGNOSIS — R7303 Prediabetes: Secondary | ICD-10-CM | POA: Diagnosis not present

## 2020-07-21 DIAGNOSIS — I129 Hypertensive chronic kidney disease with stage 1 through stage 4 chronic kidney disease, or unspecified chronic kidney disease: Secondary | ICD-10-CM | POA: Diagnosis not present

## 2020-07-21 DIAGNOSIS — G629 Polyneuropathy, unspecified: Secondary | ICD-10-CM | POA: Diagnosis not present

## 2020-07-23 ENCOUNTER — Ambulatory Visit (HOSPITAL_COMMUNITY)
Admission: RE | Admit: 2020-07-23 | Discharge: 2020-07-23 | Disposition: A | Discharge status: Home / Self Care | Payer: Medicare HMO | Source: Ambulatory Visit | Attending: Internal Medicine | Admitting: Internal Medicine

## 2020-07-23 ENCOUNTER — Other Ambulatory Visit: Payer: Self-pay

## 2020-07-23 DIAGNOSIS — M25531 Pain in right wrist: Secondary | ICD-10-CM | POA: Diagnosis not present

## 2020-07-23 DIAGNOSIS — M79641 Pain in right hand: Secondary | ICD-10-CM | POA: Diagnosis not present

## 2020-09-03 DIAGNOSIS — I469 Cardiac arrest, cause unspecified: Secondary | ICD-10-CM | POA: Diagnosis not present

## 2020-09-22 DIAGNOSIS — 419620001 Death: Secondary | SNOMED CT | POA: Diagnosis not present

## 2020-09-22 DEATH — deceased
